# Patient Record
Sex: Male | Born: 1950
Health system: Southern US, Community
[De-identification: ages and names within clinical notes are randomized; demographics above are authoritative.]

## PROBLEM LIST (undated history)

## (undated) DIAGNOSIS — I444 Left anterior fascicular block: Secondary | ICD-10-CM

## (undated) DIAGNOSIS — K2270 Barrett's esophagus without dysplasia: Secondary | ICD-10-CM

## (undated) DIAGNOSIS — J432 Centrilobular emphysema: Secondary | ICD-10-CM

## (undated) DIAGNOSIS — E6609 Other obesity due to excess calories: Secondary | ICD-10-CM

## (undated) DIAGNOSIS — M1990 Unspecified osteoarthritis, unspecified site: Secondary | ICD-10-CM

## (undated) DIAGNOSIS — G4733 Obstructive sleep apnea (adult) (pediatric): Secondary | ICD-10-CM

## (undated) DIAGNOSIS — M7662 Achilles tendinitis, left leg: Secondary | ICD-10-CM

## (undated) DIAGNOSIS — M7752 Other enthesopathy of left foot: Secondary | ICD-10-CM

## (undated) DIAGNOSIS — N400 Enlarged prostate without lower urinary tract symptoms: Secondary | ICD-10-CM

## (undated) DIAGNOSIS — Z9981 Dependence on supplemental oxygen: Secondary | ICD-10-CM

## (undated) DIAGNOSIS — J849 Interstitial pulmonary disease, unspecified: Secondary | ICD-10-CM

## (undated) DIAGNOSIS — R5381 Other malaise: Secondary | ICD-10-CM

## (undated) DIAGNOSIS — G2581 Restless legs syndrome: Secondary | ICD-10-CM

## (undated) DIAGNOSIS — R0609 Other forms of dyspnea: Secondary | ICD-10-CM

## (undated) DIAGNOSIS — Z8616 Personal history of COVID-19: Secondary | ICD-10-CM

## (undated) DIAGNOSIS — G4730 Sleep apnea, unspecified: Secondary | ICD-10-CM

## (undated) DIAGNOSIS — G8929 Other chronic pain: Secondary | ICD-10-CM

## (undated) DIAGNOSIS — F330 Major depressive disorder, recurrent, mild: Secondary | ICD-10-CM

## (undated) DIAGNOSIS — T466X5A Adverse effect of antihyperlipidemic and antiarteriosclerotic drugs, initial encounter: Secondary | ICD-10-CM

## (undated) DIAGNOSIS — I2510 Atherosclerotic heart disease of native coronary artery without angina pectoris: Secondary | ICD-10-CM

## (undated) DIAGNOSIS — R454 Irritability and anger: Secondary | ICD-10-CM

## (undated) DIAGNOSIS — Z8673 Personal history of transient ischemic attack (TIA), and cerebral infarction without residual deficits: Secondary | ICD-10-CM

## (undated) DIAGNOSIS — F419 Anxiety disorder, unspecified: Secondary | ICD-10-CM

## (undated) DIAGNOSIS — I10 Essential (primary) hypertension: Secondary | ICD-10-CM

## (undated) DIAGNOSIS — R7303 Prediabetes: Secondary | ICD-10-CM

## (undated) DIAGNOSIS — E782 Mixed hyperlipidemia: Secondary | ICD-10-CM

## (undated) DIAGNOSIS — I700 Atherosclerosis of aorta: Secondary | ICD-10-CM

## (undated) DIAGNOSIS — E66811 Obesity, class 1: Secondary | ICD-10-CM

## (undated) DIAGNOSIS — C61 Malignant neoplasm of prostate: Secondary | ICD-10-CM

## (undated) DIAGNOSIS — M51369 Other intervertebral disc degeneration, lumbar region without mention of lumbar back pain or lower extremity pain: Secondary | ICD-10-CM

## (undated) DIAGNOSIS — J449 Chronic obstructive pulmonary disease, unspecified: Secondary | ICD-10-CM

## (undated) DIAGNOSIS — I639 Cerebral infarction, unspecified: Secondary | ICD-10-CM

## (undated) DIAGNOSIS — K219 Gastro-esophageal reflux disease without esophagitis: Secondary | ICD-10-CM

## (undated) DIAGNOSIS — G4700 Insomnia, unspecified: Secondary | ICD-10-CM

## (undated) DIAGNOSIS — E291 Testicular hypofunction: Secondary | ICD-10-CM

## (undated) DIAGNOSIS — Z87442 Personal history of urinary calculi: Secondary | ICD-10-CM

## (undated) DIAGNOSIS — M791 Myalgia, unspecified site: Secondary | ICD-10-CM

## (undated) DIAGNOSIS — M7750 Other enthesopathy of unspecified foot: Secondary | ICD-10-CM

## (undated) DIAGNOSIS — R972 Elevated prostate specific antigen [PSA]: Secondary | ICD-10-CM

## (undated) DIAGNOSIS — H9191 Unspecified hearing loss, right ear: Secondary | ICD-10-CM

## (undated) DIAGNOSIS — F32A Depression, unspecified: Secondary | ICD-10-CM

## (undated) DIAGNOSIS — M5136 Other intervertebral disc degeneration, lumbar region: Secondary | ICD-10-CM

## (undated) DIAGNOSIS — J189 Pneumonia, unspecified organism: Secondary | ICD-10-CM

## (undated) HISTORY — DX: Adverse effect of antihyperlipidemic and antiarteriosclerotic drugs, initial encounter: T46.6X5A

## (undated) HISTORY — DX: Mixed hyperlipidemia: E78.2

## (undated) HISTORY — DX: Myalgia, unspecified site: M79.10

## (undated) HISTORY — DX: Other forms of dyspnea: R06.09

## (undated) HISTORY — DX: Interstitial pulmonary disease, unspecified: J84.9

## (undated) HISTORY — DX: Other malaise: R53.81

## (undated) HISTORY — DX: Restless legs syndrome: G25.81

## (undated) HISTORY — DX: Unspecified hearing loss, right ear: H91.91

## (undated) HISTORY — DX: Personal history of COVID-19: Z86.16

## (undated) HISTORY — DX: Left anterior fascicular block: I44.4

## (undated) HISTORY — DX: Obstructive sleep apnea (adult) (pediatric): G47.33

## (undated) HISTORY — DX: Centrilobular emphysema: J43.2

## (undated) HISTORY — DX: Elevated prostate specific antigen (PSA): R97.20

## (undated) HISTORY — DX: Atherosclerosis of aorta: I70.0

## (undated) HISTORY — DX: Prediabetes: R73.03

## (undated) HISTORY — DX: Testicular hypofunction: E29.1

## (undated) HISTORY — DX: Other enthesopathy of unspecified foot and ankle: M77.50

## (undated) HISTORY — DX: Unspecified osteoarthritis, unspecified site: M19.90

## (undated) HISTORY — PX: PROSTATE BIOPSY: SHX241

## (undated) HISTORY — DX: Irritability and anger: R45.4

## (undated) HISTORY — DX: Essential (primary) hypertension: I10

## (undated) HISTORY — DX: Insomnia, unspecified: G47.00

## (undated) HISTORY — DX: Other enthesopathy of left foot and ankle: M77.52

## (undated) HISTORY — DX: Achilles tendinitis, left leg: M76.62

## (undated) HISTORY — DX: Atherosclerotic heart disease of native coronary artery without angina pectoris: I25.10

## (undated) HISTORY — DX: Personal history of transient ischemic attack (TIA), and cerebral infarction without residual deficits: Z86.73

## (undated) HISTORY — DX: Other obesity due to excess calories: E66.09

## (undated) HISTORY — DX: Other intervertebral disc degeneration, lumbar region without mention of lumbar back pain or lower extremity pain: M51.369

## (undated) HISTORY — DX: Obesity, class 1: E66.811

## (undated) HISTORY — DX: Major depressive disorder, recurrent, mild: F33.0

## (undated) HISTORY — DX: Barrett's esophagus without dysplasia: K22.70

## (undated) HISTORY — DX: Other intervertebral disc degeneration, lumbar region: M51.36

## (undated) HISTORY — PX: COLONOSCOPY: SHX174

---

## 2016-03-18 DIAGNOSIS — F339 Major depressive disorder, recurrent, unspecified: Secondary | ICD-10-CM | POA: Insufficient documentation

## 2016-03-18 HISTORY — DX: Major depressive disorder, recurrent, unspecified: F33.9

## 2017-11-18 DIAGNOSIS — R7303 Prediabetes: Secondary | ICD-10-CM | POA: Diagnosis not present

## 2017-11-18 DIAGNOSIS — I10 Essential (primary) hypertension: Secondary | ICD-10-CM | POA: Diagnosis not present

## 2017-11-18 DIAGNOSIS — R350 Frequency of micturition: Secondary | ICD-10-CM | POA: Diagnosis not present

## 2017-11-18 DIAGNOSIS — Z125 Encounter for screening for malignant neoplasm of prostate: Secondary | ICD-10-CM | POA: Diagnosis not present

## 2017-11-18 DIAGNOSIS — E782 Mixed hyperlipidemia: Secondary | ICD-10-CM | POA: Diagnosis not present

## 2017-11-18 DIAGNOSIS — R5381 Other malaise: Secondary | ICD-10-CM | POA: Diagnosis not present

## 2017-11-18 DIAGNOSIS — Z0000 Encounter for general adult medical examination without abnormal findings: Secondary | ICD-10-CM | POA: Diagnosis not present

## 2017-11-18 DIAGNOSIS — K2270 Barrett's esophagus without dysplasia: Secondary | ICD-10-CM | POA: Diagnosis not present

## 2017-11-18 DIAGNOSIS — J449 Chronic obstructive pulmonary disease, unspecified: Secondary | ICD-10-CM | POA: Diagnosis not present

## 2017-11-18 DIAGNOSIS — N401 Enlarged prostate with lower urinary tract symptoms: Secondary | ICD-10-CM | POA: Diagnosis not present

## 2017-11-18 DIAGNOSIS — R5383 Other fatigue: Secondary | ICD-10-CM | POA: Diagnosis not present

## 2017-11-26 DIAGNOSIS — H903 Sensorineural hearing loss, bilateral: Secondary | ICD-10-CM | POA: Diagnosis not present

## 2017-11-26 DIAGNOSIS — H6123 Impacted cerumen, bilateral: Secondary | ICD-10-CM | POA: Diagnosis not present

## 2017-11-26 DIAGNOSIS — J069 Acute upper respiratory infection, unspecified: Secondary | ICD-10-CM | POA: Diagnosis not present

## 2017-12-15 DIAGNOSIS — H903 Sensorineural hearing loss, bilateral: Secondary | ICD-10-CM | POA: Diagnosis not present

## 2017-12-28 DIAGNOSIS — H903 Sensorineural hearing loss, bilateral: Secondary | ICD-10-CM | POA: Diagnosis not present

## 2018-03-17 DIAGNOSIS — N529 Male erectile dysfunction, unspecified: Secondary | ICD-10-CM | POA: Diagnosis not present

## 2018-03-17 DIAGNOSIS — R351 Nocturia: Secondary | ICD-10-CM | POA: Diagnosis not present

## 2018-03-17 DIAGNOSIS — N401 Enlarged prostate with lower urinary tract symptoms: Secondary | ICD-10-CM | POA: Diagnosis not present

## 2018-04-07 DIAGNOSIS — R609 Edema, unspecified: Secondary | ICD-10-CM | POA: Diagnosis not present

## 2018-04-07 DIAGNOSIS — R319 Hematuria, unspecified: Secondary | ICD-10-CM | POA: Diagnosis not present

## 2018-04-07 DIAGNOSIS — R5383 Other fatigue: Secondary | ICD-10-CM | POA: Diagnosis not present

## 2018-04-07 DIAGNOSIS — R5381 Other malaise: Secondary | ICD-10-CM | POA: Diagnosis not present

## 2018-04-07 DIAGNOSIS — M5416 Radiculopathy, lumbar region: Secondary | ICD-10-CM | POA: Diagnosis not present

## 2018-04-07 DIAGNOSIS — Z79899 Other long term (current) drug therapy: Secondary | ICD-10-CM | POA: Diagnosis not present

## 2018-04-15 DIAGNOSIS — M545 Low back pain: Secondary | ICD-10-CM | POA: Diagnosis not present

## 2018-04-15 DIAGNOSIS — M5416 Radiculopathy, lumbar region: Secondary | ICD-10-CM | POA: Diagnosis not present

## 2018-05-04 DIAGNOSIS — N529 Male erectile dysfunction, unspecified: Secondary | ICD-10-CM | POA: Diagnosis not present

## 2018-05-04 DIAGNOSIS — N401 Enlarged prostate with lower urinary tract symptoms: Secondary | ICD-10-CM | POA: Diagnosis not present

## 2018-05-11 DIAGNOSIS — M5136 Other intervertebral disc degeneration, lumbar region: Secondary | ICD-10-CM | POA: Diagnosis not present

## 2018-05-11 DIAGNOSIS — M5416 Radiculopathy, lumbar region: Secondary | ICD-10-CM | POA: Diagnosis not present

## 2018-05-18 DIAGNOSIS — I2510 Atherosclerotic heart disease of native coronary artery without angina pectoris: Secondary | ICD-10-CM | POA: Diagnosis not present

## 2018-05-18 DIAGNOSIS — J449 Chronic obstructive pulmonary disease, unspecified: Secondary | ICD-10-CM | POA: Diagnosis not present

## 2018-05-18 DIAGNOSIS — K2270 Barrett's esophagus without dysplasia: Secondary | ICD-10-CM | POA: Diagnosis not present

## 2018-05-18 DIAGNOSIS — I10 Essential (primary) hypertension: Secondary | ICD-10-CM | POA: Diagnosis not present

## 2018-05-18 DIAGNOSIS — R7303 Prediabetes: Secondary | ICD-10-CM | POA: Diagnosis not present

## 2018-07-14 DIAGNOSIS — M9904 Segmental and somatic dysfunction of sacral region: Secondary | ICD-10-CM | POA: Diagnosis not present

## 2018-07-14 DIAGNOSIS — S336XXA Sprain of sacroiliac joint, initial encounter: Secondary | ICD-10-CM | POA: Diagnosis not present

## 2018-07-14 DIAGNOSIS — M545 Low back pain: Secondary | ICD-10-CM | POA: Diagnosis not present

## 2018-07-14 DIAGNOSIS — S39012A Strain of muscle, fascia and tendon of lower back, initial encounter: Secondary | ICD-10-CM | POA: Diagnosis not present

## 2018-07-14 DIAGNOSIS — S29019A Strain of muscle and tendon of unspecified wall of thorax, initial encounter: Secondary | ICD-10-CM | POA: Diagnosis not present

## 2018-07-14 DIAGNOSIS — M9903 Segmental and somatic dysfunction of lumbar region: Secondary | ICD-10-CM | POA: Diagnosis not present

## 2018-07-14 DIAGNOSIS — M9902 Segmental and somatic dysfunction of thoracic region: Secondary | ICD-10-CM | POA: Diagnosis not present

## 2018-07-14 DIAGNOSIS — M5136 Other intervertebral disc degeneration, lumbar region: Secondary | ICD-10-CM | POA: Diagnosis not present

## 2018-07-15 DIAGNOSIS — S336XXA Sprain of sacroiliac joint, initial encounter: Secondary | ICD-10-CM | POA: Diagnosis not present

## 2018-07-15 DIAGNOSIS — M9904 Segmental and somatic dysfunction of sacral region: Secondary | ICD-10-CM | POA: Diagnosis not present

## 2018-07-15 DIAGNOSIS — S39012A Strain of muscle, fascia and tendon of lower back, initial encounter: Secondary | ICD-10-CM | POA: Diagnosis not present

## 2018-07-15 DIAGNOSIS — M9902 Segmental and somatic dysfunction of thoracic region: Secondary | ICD-10-CM | POA: Diagnosis not present

## 2018-07-15 DIAGNOSIS — M5136 Other intervertebral disc degeneration, lumbar region: Secondary | ICD-10-CM | POA: Diagnosis not present

## 2018-07-15 DIAGNOSIS — M9903 Segmental and somatic dysfunction of lumbar region: Secondary | ICD-10-CM | POA: Diagnosis not present

## 2018-07-15 DIAGNOSIS — S29019A Strain of muscle and tendon of unspecified wall of thorax, initial encounter: Secondary | ICD-10-CM | POA: Diagnosis not present

## 2018-07-15 DIAGNOSIS — M545 Low back pain: Secondary | ICD-10-CM | POA: Diagnosis not present

## 2018-07-21 DIAGNOSIS — M9903 Segmental and somatic dysfunction of lumbar region: Secondary | ICD-10-CM | POA: Diagnosis not present

## 2018-07-21 DIAGNOSIS — S39012A Strain of muscle, fascia and tendon of lower back, initial encounter: Secondary | ICD-10-CM | POA: Diagnosis not present

## 2018-07-21 DIAGNOSIS — M9904 Segmental and somatic dysfunction of sacral region: Secondary | ICD-10-CM | POA: Diagnosis not present

## 2018-07-21 DIAGNOSIS — M545 Low back pain: Secondary | ICD-10-CM | POA: Diagnosis not present

## 2018-07-21 DIAGNOSIS — M5136 Other intervertebral disc degeneration, lumbar region: Secondary | ICD-10-CM | POA: Diagnosis not present

## 2018-07-21 DIAGNOSIS — M9902 Segmental and somatic dysfunction of thoracic region: Secondary | ICD-10-CM | POA: Diagnosis not present

## 2018-07-21 DIAGNOSIS — S336XXA Sprain of sacroiliac joint, initial encounter: Secondary | ICD-10-CM | POA: Diagnosis not present

## 2018-07-21 DIAGNOSIS — S29019A Strain of muscle and tendon of unspecified wall of thorax, initial encounter: Secondary | ICD-10-CM | POA: Diagnosis not present

## 2018-07-22 DIAGNOSIS — M9902 Segmental and somatic dysfunction of thoracic region: Secondary | ICD-10-CM | POA: Diagnosis not present

## 2018-07-22 DIAGNOSIS — M9904 Segmental and somatic dysfunction of sacral region: Secondary | ICD-10-CM | POA: Diagnosis not present

## 2018-07-22 DIAGNOSIS — S29019A Strain of muscle and tendon of unspecified wall of thorax, initial encounter: Secondary | ICD-10-CM | POA: Diagnosis not present

## 2018-07-22 DIAGNOSIS — M545 Low back pain: Secondary | ICD-10-CM | POA: Diagnosis not present

## 2018-07-22 DIAGNOSIS — M5136 Other intervertebral disc degeneration, lumbar region: Secondary | ICD-10-CM | POA: Diagnosis not present

## 2018-07-22 DIAGNOSIS — S336XXA Sprain of sacroiliac joint, initial encounter: Secondary | ICD-10-CM | POA: Diagnosis not present

## 2018-07-22 DIAGNOSIS — S39012A Strain of muscle, fascia and tendon of lower back, initial encounter: Secondary | ICD-10-CM | POA: Diagnosis not present

## 2018-07-22 DIAGNOSIS — M9903 Segmental and somatic dysfunction of lumbar region: Secondary | ICD-10-CM | POA: Diagnosis not present

## 2018-07-23 DIAGNOSIS — M5136 Other intervertebral disc degeneration, lumbar region: Secondary | ICD-10-CM | POA: Diagnosis not present

## 2018-07-23 DIAGNOSIS — M9903 Segmental and somatic dysfunction of lumbar region: Secondary | ICD-10-CM | POA: Diagnosis not present

## 2018-07-23 DIAGNOSIS — M545 Low back pain: Secondary | ICD-10-CM | POA: Diagnosis not present

## 2018-07-23 DIAGNOSIS — M9904 Segmental and somatic dysfunction of sacral region: Secondary | ICD-10-CM | POA: Diagnosis not present

## 2018-07-23 DIAGNOSIS — M9902 Segmental and somatic dysfunction of thoracic region: Secondary | ICD-10-CM | POA: Diagnosis not present

## 2018-07-23 DIAGNOSIS — S39012A Strain of muscle, fascia and tendon of lower back, initial encounter: Secondary | ICD-10-CM | POA: Diagnosis not present

## 2018-07-23 DIAGNOSIS — S29019A Strain of muscle and tendon of unspecified wall of thorax, initial encounter: Secondary | ICD-10-CM | POA: Diagnosis not present

## 2018-07-23 DIAGNOSIS — S336XXA Sprain of sacroiliac joint, initial encounter: Secondary | ICD-10-CM | POA: Diagnosis not present

## 2018-07-26 DIAGNOSIS — S29019A Strain of muscle and tendon of unspecified wall of thorax, initial encounter: Secondary | ICD-10-CM | POA: Diagnosis not present

## 2018-07-26 DIAGNOSIS — M9904 Segmental and somatic dysfunction of sacral region: Secondary | ICD-10-CM | POA: Diagnosis not present

## 2018-07-26 DIAGNOSIS — M9903 Segmental and somatic dysfunction of lumbar region: Secondary | ICD-10-CM | POA: Diagnosis not present

## 2018-07-26 DIAGNOSIS — S336XXA Sprain of sacroiliac joint, initial encounter: Secondary | ICD-10-CM | POA: Diagnosis not present

## 2018-07-26 DIAGNOSIS — M5136 Other intervertebral disc degeneration, lumbar region: Secondary | ICD-10-CM | POA: Diagnosis not present

## 2018-07-26 DIAGNOSIS — S39012A Strain of muscle, fascia and tendon of lower back, initial encounter: Secondary | ICD-10-CM | POA: Diagnosis not present

## 2018-07-26 DIAGNOSIS — M9902 Segmental and somatic dysfunction of thoracic region: Secondary | ICD-10-CM | POA: Diagnosis not present

## 2018-07-26 DIAGNOSIS — M545 Low back pain: Secondary | ICD-10-CM | POA: Diagnosis not present

## 2018-07-28 DIAGNOSIS — S39012A Strain of muscle, fascia and tendon of lower back, initial encounter: Secondary | ICD-10-CM | POA: Diagnosis not present

## 2018-07-28 DIAGNOSIS — M545 Low back pain: Secondary | ICD-10-CM | POA: Diagnosis not present

## 2018-07-28 DIAGNOSIS — S336XXA Sprain of sacroiliac joint, initial encounter: Secondary | ICD-10-CM | POA: Diagnosis not present

## 2018-07-28 DIAGNOSIS — M5136 Other intervertebral disc degeneration, lumbar region: Secondary | ICD-10-CM | POA: Diagnosis not present

## 2018-07-28 DIAGNOSIS — S29019A Strain of muscle and tendon of unspecified wall of thorax, initial encounter: Secondary | ICD-10-CM | POA: Diagnosis not present

## 2018-07-28 DIAGNOSIS — M9904 Segmental and somatic dysfunction of sacral region: Secondary | ICD-10-CM | POA: Diagnosis not present

## 2018-07-28 DIAGNOSIS — M9902 Segmental and somatic dysfunction of thoracic region: Secondary | ICD-10-CM | POA: Diagnosis not present

## 2018-07-28 DIAGNOSIS — M9903 Segmental and somatic dysfunction of lumbar region: Secondary | ICD-10-CM | POA: Diagnosis not present

## 2018-07-30 DIAGNOSIS — S29019A Strain of muscle and tendon of unspecified wall of thorax, initial encounter: Secondary | ICD-10-CM | POA: Diagnosis not present

## 2018-07-30 DIAGNOSIS — M9904 Segmental and somatic dysfunction of sacral region: Secondary | ICD-10-CM | POA: Diagnosis not present

## 2018-07-30 DIAGNOSIS — S336XXA Sprain of sacroiliac joint, initial encounter: Secondary | ICD-10-CM | POA: Diagnosis not present

## 2018-07-30 DIAGNOSIS — M5136 Other intervertebral disc degeneration, lumbar region: Secondary | ICD-10-CM | POA: Diagnosis not present

## 2018-07-30 DIAGNOSIS — M545 Low back pain: Secondary | ICD-10-CM | POA: Diagnosis not present

## 2018-07-30 DIAGNOSIS — M9903 Segmental and somatic dysfunction of lumbar region: Secondary | ICD-10-CM | POA: Diagnosis not present

## 2018-07-30 DIAGNOSIS — M9902 Segmental and somatic dysfunction of thoracic region: Secondary | ICD-10-CM | POA: Diagnosis not present

## 2018-07-30 DIAGNOSIS — S39012A Strain of muscle, fascia and tendon of lower back, initial encounter: Secondary | ICD-10-CM | POA: Diagnosis not present

## 2018-08-02 DIAGNOSIS — S39012A Strain of muscle, fascia and tendon of lower back, initial encounter: Secondary | ICD-10-CM | POA: Diagnosis not present

## 2018-08-02 DIAGNOSIS — M9902 Segmental and somatic dysfunction of thoracic region: Secondary | ICD-10-CM | POA: Diagnosis not present

## 2018-08-02 DIAGNOSIS — M5136 Other intervertebral disc degeneration, lumbar region: Secondary | ICD-10-CM | POA: Diagnosis not present

## 2018-08-02 DIAGNOSIS — S29019A Strain of muscle and tendon of unspecified wall of thorax, initial encounter: Secondary | ICD-10-CM | POA: Diagnosis not present

## 2018-08-02 DIAGNOSIS — S336XXA Sprain of sacroiliac joint, initial encounter: Secondary | ICD-10-CM | POA: Diagnosis not present

## 2018-08-02 DIAGNOSIS — M545 Low back pain: Secondary | ICD-10-CM | POA: Diagnosis not present

## 2018-08-02 DIAGNOSIS — M9904 Segmental and somatic dysfunction of sacral region: Secondary | ICD-10-CM | POA: Diagnosis not present

## 2018-08-02 DIAGNOSIS — M9903 Segmental and somatic dysfunction of lumbar region: Secondary | ICD-10-CM | POA: Diagnosis not present

## 2018-08-04 DIAGNOSIS — R3129 Other microscopic hematuria: Secondary | ICD-10-CM | POA: Diagnosis not present

## 2018-08-04 DIAGNOSIS — N401 Enlarged prostate with lower urinary tract symptoms: Secondary | ICD-10-CM | POA: Diagnosis not present

## 2018-08-06 DIAGNOSIS — R3129 Other microscopic hematuria: Secondary | ICD-10-CM | POA: Diagnosis not present

## 2018-08-06 DIAGNOSIS — N200 Calculus of kidney: Secondary | ICD-10-CM | POA: Diagnosis not present

## 2018-08-06 DIAGNOSIS — I700 Atherosclerosis of aorta: Secondary | ICD-10-CM | POA: Diagnosis not present

## 2018-08-11 DIAGNOSIS — R3129 Other microscopic hematuria: Secondary | ICD-10-CM | POA: Diagnosis not present

## 2018-08-24 DIAGNOSIS — N401 Enlarged prostate with lower urinary tract symptoms: Secondary | ICD-10-CM | POA: Diagnosis not present

## 2018-08-24 DIAGNOSIS — R3129 Other microscopic hematuria: Secondary | ICD-10-CM | POA: Diagnosis not present

## 2018-08-24 DIAGNOSIS — N200 Calculus of kidney: Secondary | ICD-10-CM | POA: Diagnosis not present

## 2018-08-27 DIAGNOSIS — M150 Primary generalized (osteo)arthritis: Secondary | ICD-10-CM | POA: Diagnosis not present

## 2018-08-27 DIAGNOSIS — J449 Chronic obstructive pulmonary disease, unspecified: Secondary | ICD-10-CM | POA: Diagnosis not present

## 2018-08-27 DIAGNOSIS — I10 Essential (primary) hypertension: Secondary | ICD-10-CM | POA: Diagnosis not present

## 2018-08-27 DIAGNOSIS — Z01810 Encounter for preprocedural cardiovascular examination: Secondary | ICD-10-CM | POA: Diagnosis not present

## 2018-08-27 DIAGNOSIS — Z79899 Other long term (current) drug therapy: Secondary | ICD-10-CM | POA: Diagnosis not present

## 2018-08-27 DIAGNOSIS — I2510 Atherosclerotic heart disease of native coronary artery without angina pectoris: Secondary | ICD-10-CM | POA: Diagnosis not present

## 2018-08-27 DIAGNOSIS — Z8673 Personal history of transient ischemic attack (TIA), and cerebral infarction without residual deficits: Secondary | ICD-10-CM | POA: Diagnosis not present

## 2018-08-27 DIAGNOSIS — K2270 Barrett's esophagus without dysplasia: Secondary | ICD-10-CM | POA: Diagnosis not present

## 2018-08-27 DIAGNOSIS — M5136 Other intervertebral disc degeneration, lumbar region: Secondary | ICD-10-CM | POA: Diagnosis not present

## 2018-08-27 DIAGNOSIS — E6609 Other obesity due to excess calories: Secondary | ICD-10-CM | POA: Diagnosis not present

## 2018-08-27 DIAGNOSIS — F330 Major depressive disorder, recurrent, mild: Secondary | ICD-10-CM | POA: Diagnosis not present

## 2018-08-27 DIAGNOSIS — Z23 Encounter for immunization: Secondary | ICD-10-CM | POA: Diagnosis not present

## 2018-08-30 DIAGNOSIS — Z23 Encounter for immunization: Secondary | ICD-10-CM | POA: Diagnosis not present

## 2018-09-01 DIAGNOSIS — I2510 Atherosclerotic heart disease of native coronary artery without angina pectoris: Secondary | ICD-10-CM | POA: Diagnosis not present

## 2018-09-01 DIAGNOSIS — Z01810 Encounter for preprocedural cardiovascular examination: Secondary | ICD-10-CM | POA: Diagnosis not present

## 2018-09-03 DIAGNOSIS — N200 Calculus of kidney: Secondary | ICD-10-CM | POA: Diagnosis not present

## 2018-09-16 DIAGNOSIS — N200 Calculus of kidney: Secondary | ICD-10-CM | POA: Diagnosis not present

## 2018-09-16 DIAGNOSIS — N401 Enlarged prostate with lower urinary tract symptoms: Secondary | ICD-10-CM | POA: Diagnosis not present

## 2018-09-17 DIAGNOSIS — N200 Calculus of kidney: Secondary | ICD-10-CM | POA: Diagnosis not present

## 2018-10-28 DIAGNOSIS — N401 Enlarged prostate with lower urinary tract symptoms: Secondary | ICD-10-CM | POA: Diagnosis not present

## 2018-10-28 DIAGNOSIS — N200 Calculus of kidney: Secondary | ICD-10-CM | POA: Diagnosis not present

## 2018-12-21 DIAGNOSIS — N401 Enlarged prostate with lower urinary tract symptoms: Secondary | ICD-10-CM | POA: Diagnosis not present

## 2018-12-21 DIAGNOSIS — Z125 Encounter for screening for malignant neoplasm of prostate: Secondary | ICD-10-CM | POA: Diagnosis not present

## 2018-12-21 DIAGNOSIS — N200 Calculus of kidney: Secondary | ICD-10-CM | POA: Diagnosis not present

## 2019-01-17 DIAGNOSIS — N401 Enlarged prostate with lower urinary tract symptoms: Secondary | ICD-10-CM | POA: Diagnosis not present

## 2019-01-17 DIAGNOSIS — N200 Calculus of kidney: Secondary | ICD-10-CM | POA: Diagnosis not present

## 2019-01-21 DIAGNOSIS — I252 Old myocardial infarction: Secondary | ICD-10-CM | POA: Diagnosis not present

## 2019-01-21 DIAGNOSIS — N200 Calculus of kidney: Secondary | ICD-10-CM | POA: Diagnosis not present

## 2019-01-21 DIAGNOSIS — M1990 Unspecified osteoarthritis, unspecified site: Secondary | ICD-10-CM | POA: Diagnosis not present

## 2019-01-21 DIAGNOSIS — I2510 Atherosclerotic heart disease of native coronary artery without angina pectoris: Secondary | ICD-10-CM | POA: Diagnosis not present

## 2019-01-21 HISTORY — PX: LITHOTRIPSY: SUR834

## 2019-01-27 DIAGNOSIS — N202 Calculus of kidney with calculus of ureter: Secondary | ICD-10-CM | POA: Diagnosis not present

## 2019-01-27 DIAGNOSIS — N401 Enlarged prostate with lower urinary tract symptoms: Secondary | ICD-10-CM | POA: Diagnosis not present

## 2019-01-27 DIAGNOSIS — I878 Other specified disorders of veins: Secondary | ICD-10-CM | POA: Diagnosis not present

## 2019-01-27 DIAGNOSIS — N200 Calculus of kidney: Secondary | ICD-10-CM | POA: Diagnosis not present

## 2019-02-08 DIAGNOSIS — Z8673 Personal history of transient ischemic attack (TIA), and cerebral infarction without residual deficits: Secondary | ICD-10-CM | POA: Diagnosis not present

## 2019-02-08 DIAGNOSIS — E782 Mixed hyperlipidemia: Secondary | ICD-10-CM | POA: Diagnosis not present

## 2019-02-08 DIAGNOSIS — F5101 Primary insomnia: Secondary | ICD-10-CM | POA: Diagnosis not present

## 2019-02-08 DIAGNOSIS — N401 Enlarged prostate with lower urinary tract symptoms: Secondary | ICD-10-CM | POA: Diagnosis not present

## 2019-02-08 DIAGNOSIS — Z79899 Other long term (current) drug therapy: Secondary | ICD-10-CM | POA: Diagnosis not present

## 2019-02-08 DIAGNOSIS — R7303 Prediabetes: Secondary | ICD-10-CM | POA: Diagnosis not present

## 2019-02-08 DIAGNOSIS — I2510 Atherosclerotic heart disease of native coronary artery without angina pectoris: Secondary | ICD-10-CM | POA: Diagnosis not present

## 2019-02-08 DIAGNOSIS — J449 Chronic obstructive pulmonary disease, unspecified: Secondary | ICD-10-CM | POA: Diagnosis not present

## 2019-02-08 DIAGNOSIS — F330 Major depressive disorder, recurrent, mild: Secondary | ICD-10-CM | POA: Diagnosis not present

## 2019-02-08 DIAGNOSIS — I10 Essential (primary) hypertension: Secondary | ICD-10-CM | POA: Diagnosis not present

## 2019-02-08 DIAGNOSIS — R5381 Other malaise: Secondary | ICD-10-CM | POA: Diagnosis not present

## 2019-02-08 DIAGNOSIS — G2581 Restless legs syndrome: Secondary | ICD-10-CM | POA: Diagnosis not present

## 2019-02-08 DIAGNOSIS — M150 Primary generalized (osteo)arthritis: Secondary | ICD-10-CM | POA: Diagnosis not present

## 2019-02-08 DIAGNOSIS — R5383 Other fatigue: Secondary | ICD-10-CM | POA: Diagnosis not present

## 2019-02-14 DIAGNOSIS — I2510 Atherosclerotic heart disease of native coronary artery without angina pectoris: Secondary | ICD-10-CM | POA: Diagnosis not present

## 2019-02-14 DIAGNOSIS — N23 Unspecified renal colic: Secondary | ICD-10-CM | POA: Diagnosis not present

## 2019-02-14 DIAGNOSIS — N401 Enlarged prostate with lower urinary tract symptoms: Secondary | ICD-10-CM | POA: Diagnosis not present

## 2019-02-14 DIAGNOSIS — R1031 Right lower quadrant pain: Secondary | ICD-10-CM | POA: Diagnosis not present

## 2019-02-14 DIAGNOSIS — N200 Calculus of kidney: Secondary | ICD-10-CM | POA: Diagnosis not present

## 2019-02-14 DIAGNOSIS — I252 Old myocardial infarction: Secondary | ICD-10-CM | POA: Diagnosis not present

## 2019-02-14 DIAGNOSIS — J449 Chronic obstructive pulmonary disease, unspecified: Secondary | ICD-10-CM | POA: Diagnosis not present

## 2019-02-14 DIAGNOSIS — I10 Essential (primary) hypertension: Secondary | ICD-10-CM | POA: Diagnosis not present

## 2019-03-14 DIAGNOSIS — N401 Enlarged prostate with lower urinary tract symptoms: Secondary | ICD-10-CM | POA: Diagnosis not present

## 2019-03-14 DIAGNOSIS — N200 Calculus of kidney: Secondary | ICD-10-CM | POA: Diagnosis not present

## 2019-03-14 DIAGNOSIS — R1031 Right lower quadrant pain: Secondary | ICD-10-CM | POA: Diagnosis not present

## 2019-04-13 DIAGNOSIS — R1031 Right lower quadrant pain: Secondary | ICD-10-CM | POA: Diagnosis not present

## 2019-04-13 DIAGNOSIS — N200 Calculus of kidney: Secondary | ICD-10-CM | POA: Diagnosis not present

## 2019-10-27 DIAGNOSIS — M5136 Other intervertebral disc degeneration, lumbar region: Secondary | ICD-10-CM | POA: Diagnosis not present

## 2019-10-27 DIAGNOSIS — Z125 Encounter for screening for malignant neoplasm of prostate: Secondary | ICD-10-CM | POA: Diagnosis not present

## 2019-10-27 DIAGNOSIS — F330 Major depressive disorder, recurrent, mild: Secondary | ICD-10-CM | POA: Diagnosis not present

## 2019-10-27 DIAGNOSIS — Z0000 Encounter for general adult medical examination without abnormal findings: Secondary | ICD-10-CM | POA: Diagnosis not present

## 2019-10-27 DIAGNOSIS — N401 Enlarged prostate with lower urinary tract symptoms: Secondary | ICD-10-CM | POA: Diagnosis not present

## 2019-10-27 DIAGNOSIS — Z8673 Personal history of transient ischemic attack (TIA), and cerebral infarction without residual deficits: Secondary | ICD-10-CM | POA: Diagnosis not present

## 2019-10-27 DIAGNOSIS — Z23 Encounter for immunization: Secondary | ICD-10-CM | POA: Diagnosis not present

## 2019-10-27 DIAGNOSIS — I10 Essential (primary) hypertension: Secondary | ICD-10-CM | POA: Diagnosis not present

## 2019-10-27 DIAGNOSIS — I700 Atherosclerosis of aorta: Secondary | ICD-10-CM | POA: Diagnosis not present

## 2019-10-27 DIAGNOSIS — F419 Anxiety disorder, unspecified: Secondary | ICD-10-CM | POA: Diagnosis not present

## 2019-10-27 DIAGNOSIS — R5381 Other malaise: Secondary | ICD-10-CM | POA: Diagnosis not present

## 2019-10-27 DIAGNOSIS — R5383 Other fatigue: Secondary | ICD-10-CM | POA: Diagnosis not present

## 2019-10-27 DIAGNOSIS — R7303 Prediabetes: Secondary | ICD-10-CM | POA: Diagnosis not present

## 2019-10-27 DIAGNOSIS — E782 Mixed hyperlipidemia: Secondary | ICD-10-CM | POA: Diagnosis not present

## 2019-11-21 DIAGNOSIS — R519 Headache, unspecified: Secondary | ICD-10-CM | POA: Diagnosis not present

## 2019-11-21 DIAGNOSIS — R0602 Shortness of breath: Secondary | ICD-10-CM | POA: Diagnosis not present

## 2019-11-21 DIAGNOSIS — U071 COVID-19: Secondary | ICD-10-CM | POA: Diagnosis not present

## 2019-11-21 DIAGNOSIS — R05 Cough: Secondary | ICD-10-CM | POA: Diagnosis not present

## 2019-11-21 DIAGNOSIS — R0981 Nasal congestion: Secondary | ICD-10-CM | POA: Diagnosis not present

## 2019-11-25 DIAGNOSIS — M545 Low back pain: Secondary | ICD-10-CM | POA: Diagnosis not present

## 2019-11-25 DIAGNOSIS — Z888 Allergy status to other drugs, medicaments and biological substances status: Secondary | ICD-10-CM | POA: Diagnosis not present

## 2019-11-25 DIAGNOSIS — Z881 Allergy status to other antibiotic agents status: Secondary | ICD-10-CM | POA: Diagnosis not present

## 2019-11-25 DIAGNOSIS — Z79899 Other long term (current) drug therapy: Secondary | ICD-10-CM | POA: Diagnosis not present

## 2019-11-25 DIAGNOSIS — A4189 Other specified sepsis: Secondary | ICD-10-CM | POA: Diagnosis not present

## 2019-11-25 DIAGNOSIS — R519 Headache, unspecified: Secondary | ICD-10-CM | POA: Diagnosis not present

## 2019-11-25 DIAGNOSIS — N401 Enlarged prostate with lower urinary tract symptoms: Secondary | ICD-10-CM | POA: Diagnosis not present

## 2019-11-25 DIAGNOSIS — R079 Chest pain, unspecified: Secondary | ICD-10-CM | POA: Diagnosis not present

## 2019-11-25 DIAGNOSIS — J1289 Other viral pneumonia: Secondary | ICD-10-CM | POA: Diagnosis not present

## 2019-11-25 DIAGNOSIS — Z6831 Body mass index (BMI) 31.0-31.9, adult: Secondary | ICD-10-CM | POA: Diagnosis not present

## 2019-11-25 DIAGNOSIS — J440 Chronic obstructive pulmonary disease with acute lower respiratory infection: Secondary | ICD-10-CM | POA: Diagnosis not present

## 2019-11-25 DIAGNOSIS — U071 COVID-19: Secondary | ICD-10-CM | POA: Diagnosis not present

## 2019-11-25 DIAGNOSIS — I10 Essential (primary) hypertension: Secondary | ICD-10-CM | POA: Diagnosis not present

## 2019-11-25 DIAGNOSIS — F419 Anxiety disorder, unspecified: Secondary | ICD-10-CM | POA: Diagnosis not present

## 2019-11-25 DIAGNOSIS — E785 Hyperlipidemia, unspecified: Secondary | ICD-10-CM | POA: Diagnosis not present

## 2019-11-25 DIAGNOSIS — R7303 Prediabetes: Secondary | ICD-10-CM | POA: Diagnosis not present

## 2019-11-25 DIAGNOSIS — J1282 Pneumonia due to coronavirus disease 2019: Secondary | ICD-10-CM | POA: Diagnosis not present

## 2019-11-25 DIAGNOSIS — R350 Frequency of micturition: Secondary | ICD-10-CM | POA: Diagnosis not present

## 2019-11-25 DIAGNOSIS — G8929 Other chronic pain: Secondary | ICD-10-CM | POA: Diagnosis not present

## 2019-11-25 DIAGNOSIS — J189 Pneumonia, unspecified organism: Secondary | ICD-10-CM | POA: Diagnosis not present

## 2019-11-25 DIAGNOSIS — E876 Hypokalemia: Secondary | ICD-10-CM | POA: Diagnosis not present

## 2019-11-25 DIAGNOSIS — R0602 Shortness of breath: Secondary | ICD-10-CM | POA: Diagnosis not present

## 2019-11-25 DIAGNOSIS — R6520 Severe sepsis without septic shock: Secondary | ICD-10-CM | POA: Diagnosis not present

## 2019-11-25 DIAGNOSIS — J9601 Acute respiratory failure with hypoxia: Secondary | ICD-10-CM | POA: Diagnosis not present

## 2019-11-25 DIAGNOSIS — N400 Enlarged prostate without lower urinary tract symptoms: Secondary | ICD-10-CM | POA: Diagnosis not present

## 2019-11-25 DIAGNOSIS — Z7902 Long term (current) use of antithrombotics/antiplatelets: Secondary | ICD-10-CM | POA: Diagnosis not present

## 2019-11-25 DIAGNOSIS — E8809 Other disorders of plasma-protein metabolism, not elsewhere classified: Secondary | ICD-10-CM | POA: Diagnosis not present

## 2019-11-25 DIAGNOSIS — Z8673 Personal history of transient ischemic attack (TIA), and cerebral infarction without residual deficits: Secondary | ICD-10-CM | POA: Diagnosis not present

## 2019-11-25 DIAGNOSIS — E669 Obesity, unspecified: Secondary | ICD-10-CM | POA: Diagnosis not present

## 2019-11-25 DIAGNOSIS — I2510 Atherosclerotic heart disease of native coronary artery without angina pectoris: Secondary | ICD-10-CM | POA: Diagnosis not present

## 2019-11-25 DIAGNOSIS — Z87891 Personal history of nicotine dependence: Secondary | ICD-10-CM | POA: Diagnosis not present

## 2019-11-25 DIAGNOSIS — E782 Mixed hyperlipidemia: Secondary | ICD-10-CM | POA: Diagnosis not present

## 2019-11-25 DIAGNOSIS — J449 Chronic obstructive pulmonary disease, unspecified: Secondary | ICD-10-CM | POA: Diagnosis not present

## 2019-11-25 DIAGNOSIS — Z9981 Dependence on supplemental oxygen: Secondary | ICD-10-CM | POA: Diagnosis not present

## 2019-11-25 DIAGNOSIS — R5383 Other fatigue: Secondary | ICD-10-CM | POA: Diagnosis not present

## 2019-12-06 DIAGNOSIS — J449 Chronic obstructive pulmonary disease, unspecified: Secondary | ICD-10-CM | POA: Diagnosis not present

## 2019-12-06 DIAGNOSIS — U071 COVID-19: Secondary | ICD-10-CM | POA: Diagnosis not present

## 2019-12-07 DIAGNOSIS — Z8673 Personal history of transient ischemic attack (TIA), and cerebral infarction without residual deficits: Secondary | ICD-10-CM | POA: Diagnosis not present

## 2019-12-07 DIAGNOSIS — E6609 Other obesity due to excess calories: Secondary | ICD-10-CM | POA: Diagnosis not present

## 2019-12-07 DIAGNOSIS — J1282 Pneumonia due to coronavirus disease 2019: Secondary | ICD-10-CM | POA: Diagnosis not present

## 2019-12-07 DIAGNOSIS — R7303 Prediabetes: Secondary | ICD-10-CM | POA: Diagnosis not present

## 2019-12-07 DIAGNOSIS — J449 Chronic obstructive pulmonary disease, unspecified: Secondary | ICD-10-CM | POA: Diagnosis not present

## 2019-12-07 DIAGNOSIS — D649 Anemia, unspecified: Secondary | ICD-10-CM | POA: Diagnosis not present

## 2019-12-07 DIAGNOSIS — U071 COVID-19: Secondary | ICD-10-CM | POA: Diagnosis not present

## 2019-12-07 DIAGNOSIS — E782 Mixed hyperlipidemia: Secondary | ICD-10-CM | POA: Diagnosis not present

## 2019-12-07 DIAGNOSIS — I10 Essential (primary) hypertension: Secondary | ICD-10-CM | POA: Diagnosis not present

## 2019-12-07 DIAGNOSIS — Z6830 Body mass index (BMI) 30.0-30.9, adult: Secondary | ICD-10-CM | POA: Diagnosis not present

## 2019-12-22 DIAGNOSIS — Z8673 Personal history of transient ischemic attack (TIA), and cerebral infarction without residual deficits: Secondary | ICD-10-CM | POA: Diagnosis not present

## 2019-12-22 DIAGNOSIS — R7303 Prediabetes: Secondary | ICD-10-CM | POA: Diagnosis not present

## 2019-12-22 DIAGNOSIS — J1282 Pneumonia due to coronavirus disease 2019: Secondary | ICD-10-CM | POA: Diagnosis not present

## 2019-12-22 DIAGNOSIS — F419 Anxiety disorder, unspecified: Secondary | ICD-10-CM | POA: Diagnosis not present

## 2019-12-22 DIAGNOSIS — R05 Cough: Secondary | ICD-10-CM | POA: Diagnosis not present

## 2019-12-22 DIAGNOSIS — E876 Hypokalemia: Secondary | ICD-10-CM | POA: Diagnosis not present

## 2019-12-22 DIAGNOSIS — R197 Diarrhea, unspecified: Secondary | ICD-10-CM | POA: Diagnosis not present

## 2019-12-22 DIAGNOSIS — J9621 Acute and chronic respiratory failure with hypoxia: Secondary | ICD-10-CM | POA: Diagnosis not present

## 2019-12-22 DIAGNOSIS — Z8616 Personal history of COVID-19: Secondary | ICD-10-CM | POA: Diagnosis not present

## 2019-12-22 DIAGNOSIS — R918 Other nonspecific abnormal finding of lung field: Secondary | ICD-10-CM | POA: Diagnosis not present

## 2019-12-22 DIAGNOSIS — E43 Unspecified severe protein-calorie malnutrition: Secondary | ICD-10-CM | POA: Diagnosis not present

## 2019-12-22 DIAGNOSIS — R112 Nausea with vomiting, unspecified: Secondary | ICD-10-CM | POA: Diagnosis not present

## 2019-12-22 DIAGNOSIS — Z7902 Long term (current) use of antithrombotics/antiplatelets: Secondary | ICD-10-CM | POA: Diagnosis not present

## 2019-12-22 DIAGNOSIS — Z9981 Dependence on supplemental oxygen: Secondary | ICD-10-CM | POA: Diagnosis not present

## 2019-12-22 DIAGNOSIS — E782 Mixed hyperlipidemia: Secondary | ICD-10-CM | POA: Diagnosis not present

## 2019-12-22 DIAGNOSIS — I10 Essential (primary) hypertension: Secondary | ICD-10-CM | POA: Diagnosis not present

## 2019-12-22 DIAGNOSIS — U071 COVID-19: Secondary | ICD-10-CM | POA: Diagnosis not present

## 2019-12-22 DIAGNOSIS — J181 Lobar pneumonia, unspecified organism: Secondary | ICD-10-CM | POA: Diagnosis not present

## 2019-12-22 DIAGNOSIS — F329 Major depressive disorder, single episode, unspecified: Secondary | ICD-10-CM | POA: Diagnosis not present

## 2019-12-22 DIAGNOSIS — R531 Weakness: Secondary | ICD-10-CM | POA: Diagnosis not present

## 2019-12-22 DIAGNOSIS — N400 Enlarged prostate without lower urinary tract symptoms: Secondary | ICD-10-CM | POA: Diagnosis not present

## 2019-12-22 DIAGNOSIS — B948 Sequelae of other specified infectious and parasitic diseases: Secondary | ICD-10-CM | POA: Diagnosis not present

## 2019-12-22 DIAGNOSIS — R9389 Abnormal findings on diagnostic imaging of other specified body structures: Secondary | ICD-10-CM | POA: Diagnosis not present

## 2019-12-22 DIAGNOSIS — R0602 Shortness of breath: Secondary | ICD-10-CM | POA: Diagnosis not present

## 2019-12-22 DIAGNOSIS — J440 Chronic obstructive pulmonary disease with acute lower respiratory infection: Secondary | ICD-10-CM | POA: Diagnosis not present

## 2019-12-22 DIAGNOSIS — K219 Gastro-esophageal reflux disease without esophagitis: Secondary | ICD-10-CM | POA: Diagnosis not present

## 2019-12-22 DIAGNOSIS — Z20822 Contact with and (suspected) exposure to covid-19: Secondary | ICD-10-CM | POA: Diagnosis not present

## 2019-12-22 DIAGNOSIS — J9601 Acute respiratory failure with hypoxia: Secondary | ICD-10-CM | POA: Diagnosis not present

## 2019-12-22 DIAGNOSIS — J449 Chronic obstructive pulmonary disease, unspecified: Secondary | ICD-10-CM | POA: Diagnosis not present

## 2019-12-22 DIAGNOSIS — Z87891 Personal history of nicotine dependence: Secondary | ICD-10-CM | POA: Diagnosis not present

## 2019-12-22 DIAGNOSIS — I639 Cerebral infarction, unspecified: Secondary | ICD-10-CM | POA: Diagnosis not present

## 2019-12-22 DIAGNOSIS — J159 Unspecified bacterial pneumonia: Secondary | ICD-10-CM | POA: Diagnosis not present

## 2019-12-22 DIAGNOSIS — R5381 Other malaise: Secondary | ICD-10-CM | POA: Diagnosis not present

## 2019-12-22 DIAGNOSIS — R0902 Hypoxemia: Secondary | ICD-10-CM | POA: Diagnosis not present

## 2019-12-22 DIAGNOSIS — J189 Pneumonia, unspecified organism: Secondary | ICD-10-CM | POA: Diagnosis not present

## 2019-12-22 DIAGNOSIS — Z79899 Other long term (current) drug therapy: Secondary | ICD-10-CM | POA: Diagnosis not present

## 2019-12-22 DIAGNOSIS — Z7901 Long term (current) use of anticoagulants: Secondary | ICD-10-CM | POA: Diagnosis not present

## 2020-01-02 MED ORDER — GENERIC EXTERNAL MEDICATION
6.00 | Status: DC
Start: 2020-01-03 — End: 2020-01-02

## 2020-01-02 MED ORDER — ONDANSETRON HCL 4 MG/2ML IJ SOLN
4.00 | INTRAMUSCULAR | Status: DC
Start: ? — End: 2020-01-02

## 2020-01-02 MED ORDER — CLOPIDOGREL BISULFATE 75 MG PO TABS
75.00 | ORAL_TABLET | ORAL | Status: DC
Start: 2020-01-03 — End: 2020-01-02

## 2020-01-02 MED ORDER — QUETIAPINE FUMARATE 50 MG PO TABS
50.00 | ORAL_TABLET | ORAL | Status: DC
Start: 2020-01-02 — End: 2020-01-02

## 2020-01-02 MED ORDER — NITROGLYCERIN 0.4 MG SL SUBL
0.40 | SUBLINGUAL_TABLET | SUBLINGUAL | Status: DC
Start: ? — End: 2020-01-02

## 2020-01-02 MED ORDER — PANTOPRAZOLE SODIUM 40 MG PO TBEC
40.00 | DELAYED_RELEASE_TABLET | ORAL | Status: DC
Start: 2020-01-03 — End: 2020-01-02

## 2020-01-02 MED ORDER — FUROSEMIDE 40 MG PO TABS
40.00 | ORAL_TABLET | ORAL | Status: DC
Start: 2020-01-03 — End: 2020-01-02

## 2020-01-02 MED ORDER — ALBUTEROL SULFATE HFA 108 (90 BASE) MCG/ACT IN AERS
2.00 | INHALATION_SPRAY | RESPIRATORY_TRACT | Status: DC
Start: ? — End: 2020-01-02

## 2020-01-02 MED ORDER — TAMSULOSIN HCL 0.4 MG PO CAPS
0.40 | ORAL_CAPSULE | ORAL | Status: DC
Start: 2020-01-03 — End: 2020-01-02

## 2020-01-02 MED ORDER — GUAIFENESIN ER 600 MG PO TB12
600.00 | ORAL_TABLET | ORAL | Status: DC
Start: 2020-01-03 — End: 2020-01-02

## 2020-01-02 MED ORDER — TRAMADOL HCL 50 MG PO TABS
50.00 | ORAL_TABLET | ORAL | Status: DC
Start: ? — End: 2020-01-02

## 2020-01-02 MED ORDER — ACETAMINOPHEN 500 MG PO TABS
1000.00 | ORAL_TABLET | ORAL | Status: DC
Start: ? — End: 2020-01-02

## 2020-01-02 MED ORDER — FLUTICASONE PROPIONATE 50 MCG/ACT NA SUSP
1.00 | NASAL | Status: DC
Start: 2020-01-03 — End: 2020-01-02

## 2020-01-02 MED ORDER — ALBUTEROL SULFATE HFA 108 (90 BASE) MCG/ACT IN AERS
2.00 | INHALATION_SPRAY | RESPIRATORY_TRACT | Status: DC
Start: 2020-01-03 — End: 2020-01-02

## 2020-01-02 MED ORDER — ALPRAZOLAM 0.5 MG PO TABS
0.50 | ORAL_TABLET | ORAL | Status: DC
Start: ? — End: 2020-01-02

## 2020-01-02 MED ORDER — CALCIUM CARBONATE ANTACID 500 MG PO CHEW
500.00 | CHEWABLE_TABLET | ORAL | Status: DC
Start: 2020-01-03 — End: 2020-01-02

## 2020-01-02 MED ORDER — ENOXAPARIN SODIUM 40 MG/0.4ML ~~LOC~~ SOLN
40.00 | SUBCUTANEOUS | Status: DC
Start: 2020-01-03 — End: 2020-01-02

## 2020-01-07 DIAGNOSIS — J440 Chronic obstructive pulmonary disease with acute lower respiratory infection: Secondary | ICD-10-CM | POA: Diagnosis not present

## 2020-01-07 DIAGNOSIS — E663 Overweight: Secondary | ICD-10-CM | POA: Diagnosis not present

## 2020-01-07 DIAGNOSIS — N400 Enlarged prostate without lower urinary tract symptoms: Secondary | ICD-10-CM | POA: Diagnosis not present

## 2020-01-07 DIAGNOSIS — J9621 Acute and chronic respiratory failure with hypoxia: Secondary | ICD-10-CM | POA: Diagnosis not present

## 2020-01-07 DIAGNOSIS — E43 Unspecified severe protein-calorie malnutrition: Secondary | ICD-10-CM | POA: Diagnosis not present

## 2020-01-07 DIAGNOSIS — J1282 Pneumonia due to coronavirus disease 2019: Secondary | ICD-10-CM | POA: Diagnosis not present

## 2020-01-07 DIAGNOSIS — F419 Anxiety disorder, unspecified: Secondary | ICD-10-CM | POA: Diagnosis not present

## 2020-01-07 DIAGNOSIS — U071 COVID-19: Secondary | ICD-10-CM | POA: Diagnosis not present

## 2020-01-07 DIAGNOSIS — Z87891 Personal history of nicotine dependence: Secondary | ICD-10-CM | POA: Diagnosis not present

## 2020-01-07 DIAGNOSIS — Z8673 Personal history of transient ischemic attack (TIA), and cerebral infarction without residual deficits: Secondary | ICD-10-CM | POA: Diagnosis not present

## 2020-01-07 DIAGNOSIS — Z6829 Body mass index (BMI) 29.0-29.9, adult: Secondary | ICD-10-CM | POA: Diagnosis not present

## 2020-01-07 DIAGNOSIS — E782 Mixed hyperlipidemia: Secondary | ICD-10-CM | POA: Diagnosis not present

## 2020-01-07 DIAGNOSIS — I10 Essential (primary) hypertension: Secondary | ICD-10-CM | POA: Diagnosis not present

## 2020-01-16 DIAGNOSIS — Z8673 Personal history of transient ischemic attack (TIA), and cerebral infarction without residual deficits: Secondary | ICD-10-CM | POA: Diagnosis not present

## 2020-01-16 DIAGNOSIS — J440 Chronic obstructive pulmonary disease with acute lower respiratory infection: Secondary | ICD-10-CM | POA: Diagnosis not present

## 2020-01-16 DIAGNOSIS — Z87891 Personal history of nicotine dependence: Secondary | ICD-10-CM | POA: Diagnosis not present

## 2020-01-16 DIAGNOSIS — E782 Mixed hyperlipidemia: Secondary | ICD-10-CM | POA: Diagnosis not present

## 2020-01-16 DIAGNOSIS — F419 Anxiety disorder, unspecified: Secondary | ICD-10-CM | POA: Diagnosis not present

## 2020-01-16 DIAGNOSIS — E43 Unspecified severe protein-calorie malnutrition: Secondary | ICD-10-CM | POA: Diagnosis not present

## 2020-01-16 DIAGNOSIS — E663 Overweight: Secondary | ICD-10-CM | POA: Diagnosis not present

## 2020-01-16 DIAGNOSIS — Z6829 Body mass index (BMI) 29.0-29.9, adult: Secondary | ICD-10-CM | POA: Diagnosis not present

## 2020-01-16 DIAGNOSIS — J9621 Acute and chronic respiratory failure with hypoxia: Secondary | ICD-10-CM | POA: Diagnosis not present

## 2020-01-16 DIAGNOSIS — I10 Essential (primary) hypertension: Secondary | ICD-10-CM | POA: Diagnosis not present

## 2020-01-16 DIAGNOSIS — N400 Enlarged prostate without lower urinary tract symptoms: Secondary | ICD-10-CM | POA: Diagnosis not present

## 2020-01-16 DIAGNOSIS — J1282 Pneumonia due to coronavirus disease 2019: Secondary | ICD-10-CM | POA: Diagnosis not present

## 2020-01-16 DIAGNOSIS — U071 COVID-19: Secondary | ICD-10-CM | POA: Diagnosis not present

## 2020-01-18 DIAGNOSIS — J449 Chronic obstructive pulmonary disease, unspecified: Secondary | ICD-10-CM | POA: Diagnosis not present

## 2020-01-18 DIAGNOSIS — J849 Interstitial pulmonary disease, unspecified: Secondary | ICD-10-CM | POA: Diagnosis not present

## 2020-01-18 DIAGNOSIS — R0602 Shortness of breath: Secondary | ICD-10-CM | POA: Diagnosis not present

## 2020-01-18 DIAGNOSIS — J9601 Acute respiratory failure with hypoxia: Secondary | ICD-10-CM | POA: Diagnosis not present

## 2020-01-19 DIAGNOSIS — U071 COVID-19: Secondary | ICD-10-CM | POA: Diagnosis not present

## 2020-01-19 DIAGNOSIS — R5383 Other fatigue: Secondary | ICD-10-CM | POA: Diagnosis not present

## 2020-01-19 DIAGNOSIS — R7303 Prediabetes: Secondary | ICD-10-CM | POA: Diagnosis not present

## 2020-01-19 DIAGNOSIS — J1282 Pneumonia due to coronavirus disease 2019: Secondary | ICD-10-CM | POA: Diagnosis not present

## 2020-01-19 DIAGNOSIS — E441 Mild protein-calorie malnutrition: Secondary | ICD-10-CM | POA: Diagnosis not present

## 2020-01-19 DIAGNOSIS — J9611 Chronic respiratory failure with hypoxia: Secondary | ICD-10-CM | POA: Diagnosis not present

## 2020-01-19 DIAGNOSIS — J449 Chronic obstructive pulmonary disease, unspecified: Secondary | ICD-10-CM | POA: Diagnosis not present

## 2020-01-19 DIAGNOSIS — R5381 Other malaise: Secondary | ICD-10-CM | POA: Diagnosis not present

## 2020-02-03 DIAGNOSIS — J449 Chronic obstructive pulmonary disease, unspecified: Secondary | ICD-10-CM | POA: Diagnosis not present

## 2020-02-03 DIAGNOSIS — U071 COVID-19: Secondary | ICD-10-CM | POA: Diagnosis not present

## 2020-02-06 DIAGNOSIS — U071 COVID-19: Secondary | ICD-10-CM | POA: Diagnosis not present

## 2020-02-06 DIAGNOSIS — J440 Chronic obstructive pulmonary disease with acute lower respiratory infection: Secondary | ICD-10-CM | POA: Diagnosis not present

## 2020-02-06 DIAGNOSIS — E663 Overweight: Secondary | ICD-10-CM | POA: Diagnosis not present

## 2020-02-06 DIAGNOSIS — J1282 Pneumonia due to coronavirus disease 2019: Secondary | ICD-10-CM | POA: Diagnosis not present

## 2020-02-06 DIAGNOSIS — E782 Mixed hyperlipidemia: Secondary | ICD-10-CM | POA: Diagnosis not present

## 2020-02-06 DIAGNOSIS — F419 Anxiety disorder, unspecified: Secondary | ICD-10-CM | POA: Diagnosis not present

## 2020-02-06 DIAGNOSIS — J9621 Acute and chronic respiratory failure with hypoxia: Secondary | ICD-10-CM | POA: Diagnosis not present

## 2020-02-06 DIAGNOSIS — N400 Enlarged prostate without lower urinary tract symptoms: Secondary | ICD-10-CM | POA: Diagnosis not present

## 2020-02-06 DIAGNOSIS — Z6829 Body mass index (BMI) 29.0-29.9, adult: Secondary | ICD-10-CM | POA: Diagnosis not present

## 2020-02-06 DIAGNOSIS — I10 Essential (primary) hypertension: Secondary | ICD-10-CM | POA: Diagnosis not present

## 2020-02-06 DIAGNOSIS — Z8673 Personal history of transient ischemic attack (TIA), and cerebral infarction without residual deficits: Secondary | ICD-10-CM | POA: Diagnosis not present

## 2020-02-06 DIAGNOSIS — E43 Unspecified severe protein-calorie malnutrition: Secondary | ICD-10-CM | POA: Diagnosis not present

## 2020-02-16 DIAGNOSIS — Z8673 Personal history of transient ischemic attack (TIA), and cerebral infarction without residual deficits: Secondary | ICD-10-CM | POA: Diagnosis not present

## 2020-02-16 DIAGNOSIS — I10 Essential (primary) hypertension: Secondary | ICD-10-CM | POA: Diagnosis not present

## 2020-02-16 DIAGNOSIS — U071 COVID-19: Secondary | ICD-10-CM | POA: Diagnosis not present

## 2020-02-16 DIAGNOSIS — Z6829 Body mass index (BMI) 29.0-29.9, adult: Secondary | ICD-10-CM | POA: Diagnosis not present

## 2020-02-16 DIAGNOSIS — J1282 Pneumonia due to coronavirus disease 2019: Secondary | ICD-10-CM | POA: Diagnosis not present

## 2020-02-16 DIAGNOSIS — E43 Unspecified severe protein-calorie malnutrition: Secondary | ICD-10-CM | POA: Diagnosis not present

## 2020-02-16 DIAGNOSIS — J440 Chronic obstructive pulmonary disease with acute lower respiratory infection: Secondary | ICD-10-CM | POA: Diagnosis not present

## 2020-02-16 DIAGNOSIS — E663 Overweight: Secondary | ICD-10-CM | POA: Diagnosis not present

## 2020-02-16 DIAGNOSIS — E782 Mixed hyperlipidemia: Secondary | ICD-10-CM | POA: Diagnosis not present

## 2020-02-16 DIAGNOSIS — N400 Enlarged prostate without lower urinary tract symptoms: Secondary | ICD-10-CM | POA: Diagnosis not present

## 2020-02-16 DIAGNOSIS — J9621 Acute and chronic respiratory failure with hypoxia: Secondary | ICD-10-CM | POA: Diagnosis not present

## 2020-02-16 DIAGNOSIS — Z87891 Personal history of nicotine dependence: Secondary | ICD-10-CM | POA: Diagnosis not present

## 2020-02-16 DIAGNOSIS — F419 Anxiety disorder, unspecified: Secondary | ICD-10-CM | POA: Diagnosis not present

## 2020-02-29 DIAGNOSIS — J449 Chronic obstructive pulmonary disease, unspecified: Secondary | ICD-10-CM | POA: Diagnosis not present

## 2020-02-29 DIAGNOSIS — J9611 Chronic respiratory failure with hypoxia: Secondary | ICD-10-CM | POA: Diagnosis not present

## 2020-02-29 DIAGNOSIS — R7303 Prediabetes: Secondary | ICD-10-CM | POA: Diagnosis not present

## 2020-02-29 DIAGNOSIS — I10 Essential (primary) hypertension: Secondary | ICD-10-CM | POA: Diagnosis not present

## 2020-02-29 DIAGNOSIS — F330 Major depressive disorder, recurrent, mild: Secondary | ICD-10-CM | POA: Diagnosis not present

## 2020-02-29 DIAGNOSIS — Z8616 Personal history of COVID-19: Secondary | ICD-10-CM | POA: Diagnosis not present

## 2020-03-05 DIAGNOSIS — J449 Chronic obstructive pulmonary disease, unspecified: Secondary | ICD-10-CM | POA: Diagnosis not present

## 2020-03-05 DIAGNOSIS — U071 COVID-19: Secondary | ICD-10-CM | POA: Diagnosis not present

## 2020-03-07 DIAGNOSIS — Z87891 Personal history of nicotine dependence: Secondary | ICD-10-CM | POA: Diagnosis not present

## 2020-03-07 DIAGNOSIS — E663 Overweight: Secondary | ICD-10-CM | POA: Diagnosis not present

## 2020-03-07 DIAGNOSIS — I10 Essential (primary) hypertension: Secondary | ICD-10-CM | POA: Diagnosis not present

## 2020-03-07 DIAGNOSIS — E782 Mixed hyperlipidemia: Secondary | ICD-10-CM | POA: Diagnosis not present

## 2020-03-07 DIAGNOSIS — J9621 Acute and chronic respiratory failure with hypoxia: Secondary | ICD-10-CM | POA: Diagnosis not present

## 2020-03-07 DIAGNOSIS — N400 Enlarged prostate without lower urinary tract symptoms: Secondary | ICD-10-CM | POA: Diagnosis not present

## 2020-03-07 DIAGNOSIS — Z9981 Dependence on supplemental oxygen: Secondary | ICD-10-CM | POA: Diagnosis not present

## 2020-03-07 DIAGNOSIS — Z6829 Body mass index (BMI) 29.0-29.9, adult: Secondary | ICD-10-CM | POA: Diagnosis not present

## 2020-03-07 DIAGNOSIS — Z8701 Personal history of pneumonia (recurrent): Secondary | ICD-10-CM | POA: Diagnosis not present

## 2020-03-07 DIAGNOSIS — F419 Anxiety disorder, unspecified: Secondary | ICD-10-CM | POA: Diagnosis not present

## 2020-03-07 DIAGNOSIS — J449 Chronic obstructive pulmonary disease, unspecified: Secondary | ICD-10-CM | POA: Diagnosis not present

## 2020-03-07 DIAGNOSIS — Z8616 Personal history of COVID-19: Secondary | ICD-10-CM | POA: Diagnosis not present

## 2020-03-07 DIAGNOSIS — Z8673 Personal history of transient ischemic attack (TIA), and cerebral infarction without residual deficits: Secondary | ICD-10-CM | POA: Diagnosis not present

## 2020-03-17 DIAGNOSIS — N400 Enlarged prostate without lower urinary tract symptoms: Secondary | ICD-10-CM | POA: Diagnosis not present

## 2020-03-17 DIAGNOSIS — F419 Anxiety disorder, unspecified: Secondary | ICD-10-CM | POA: Diagnosis not present

## 2020-03-17 DIAGNOSIS — E663 Overweight: Secondary | ICD-10-CM | POA: Diagnosis not present

## 2020-03-17 DIAGNOSIS — Z87891 Personal history of nicotine dependence: Secondary | ICD-10-CM | POA: Diagnosis not present

## 2020-03-17 DIAGNOSIS — Z6829 Body mass index (BMI) 29.0-29.9, adult: Secondary | ICD-10-CM | POA: Diagnosis not present

## 2020-03-17 DIAGNOSIS — Z9981 Dependence on supplemental oxygen: Secondary | ICD-10-CM | POA: Diagnosis not present

## 2020-03-17 DIAGNOSIS — J9621 Acute and chronic respiratory failure with hypoxia: Secondary | ICD-10-CM | POA: Diagnosis not present

## 2020-03-17 DIAGNOSIS — I10 Essential (primary) hypertension: Secondary | ICD-10-CM | POA: Diagnosis not present

## 2020-03-17 DIAGNOSIS — Z8616 Personal history of COVID-19: Secondary | ICD-10-CM | POA: Diagnosis not present

## 2020-03-17 DIAGNOSIS — Z8701 Personal history of pneumonia (recurrent): Secondary | ICD-10-CM | POA: Diagnosis not present

## 2020-03-17 DIAGNOSIS — Z8673 Personal history of transient ischemic attack (TIA), and cerebral infarction without residual deficits: Secondary | ICD-10-CM | POA: Diagnosis not present

## 2020-03-17 DIAGNOSIS — J449 Chronic obstructive pulmonary disease, unspecified: Secondary | ICD-10-CM | POA: Diagnosis not present

## 2020-03-17 DIAGNOSIS — E782 Mixed hyperlipidemia: Secondary | ICD-10-CM | POA: Diagnosis not present

## 2020-03-19 DIAGNOSIS — J849 Interstitial pulmonary disease, unspecified: Secondary | ICD-10-CM | POA: Diagnosis not present

## 2020-03-19 DIAGNOSIS — J984 Other disorders of lung: Secondary | ICD-10-CM | POA: Diagnosis not present

## 2020-04-04 DIAGNOSIS — U071 COVID-19: Secondary | ICD-10-CM | POA: Diagnosis not present

## 2020-04-04 DIAGNOSIS — J449 Chronic obstructive pulmonary disease, unspecified: Secondary | ICD-10-CM | POA: Diagnosis not present

## 2020-04-09 DIAGNOSIS — J9611 Chronic respiratory failure with hypoxia: Secondary | ICD-10-CM | POA: Diagnosis not present

## 2020-04-09 DIAGNOSIS — R0600 Dyspnea, unspecified: Secondary | ICD-10-CM | POA: Diagnosis not present

## 2020-04-09 DIAGNOSIS — G4733 Obstructive sleep apnea (adult) (pediatric): Secondary | ICD-10-CM | POA: Diagnosis not present

## 2020-04-09 DIAGNOSIS — J849 Interstitial pulmonary disease, unspecified: Secondary | ICD-10-CM | POA: Diagnosis not present

## 2020-04-18 DIAGNOSIS — J8410 Pulmonary fibrosis, unspecified: Secondary | ICD-10-CM | POA: Diagnosis not present

## 2020-04-18 DIAGNOSIS — J849 Interstitial pulmonary disease, unspecified: Secondary | ICD-10-CM | POA: Diagnosis not present

## 2020-05-01 DIAGNOSIS — Z87891 Personal history of nicotine dependence: Secondary | ICD-10-CM | POA: Diagnosis not present

## 2020-05-01 DIAGNOSIS — J449 Chronic obstructive pulmonary disease, unspecified: Secondary | ICD-10-CM | POA: Diagnosis not present

## 2020-05-01 DIAGNOSIS — J984 Other disorders of lung: Secondary | ICD-10-CM | POA: Diagnosis not present

## 2020-05-03 DIAGNOSIS — M7662 Achilles tendinitis, left leg: Secondary | ICD-10-CM | POA: Diagnosis not present

## 2020-05-03 DIAGNOSIS — M7752 Other enthesopathy of left foot: Secondary | ICD-10-CM | POA: Diagnosis not present

## 2020-05-03 DIAGNOSIS — M10071 Idiopathic gout, right ankle and foot: Secondary | ICD-10-CM | POA: Diagnosis not present

## 2020-05-05 DIAGNOSIS — U071 COVID-19: Secondary | ICD-10-CM | POA: Diagnosis not present

## 2020-05-05 DIAGNOSIS — J449 Chronic obstructive pulmonary disease, unspecified: Secondary | ICD-10-CM | POA: Diagnosis not present

## 2020-05-17 DIAGNOSIS — M7752 Other enthesopathy of left foot: Secondary | ICD-10-CM | POA: Diagnosis not present

## 2020-05-17 DIAGNOSIS — M7662 Achilles tendinitis, left leg: Secondary | ICD-10-CM | POA: Diagnosis not present

## 2020-05-17 DIAGNOSIS — M10071 Idiopathic gout, right ankle and foot: Secondary | ICD-10-CM | POA: Diagnosis not present

## 2020-05-18 DIAGNOSIS — J849 Interstitial pulmonary disease, unspecified: Secondary | ICD-10-CM | POA: Diagnosis not present

## 2020-06-04 DIAGNOSIS — U071 COVID-19: Secondary | ICD-10-CM | POA: Diagnosis not present

## 2020-06-04 DIAGNOSIS — J449 Chronic obstructive pulmonary disease, unspecified: Secondary | ICD-10-CM | POA: Diagnosis not present

## 2020-06-18 DIAGNOSIS — J849 Interstitial pulmonary disease, unspecified: Secondary | ICD-10-CM | POA: Diagnosis not present

## 2020-06-21 DIAGNOSIS — J849 Interstitial pulmonary disease, unspecified: Secondary | ICD-10-CM | POA: Diagnosis not present

## 2020-06-21 DIAGNOSIS — G4733 Obstructive sleep apnea (adult) (pediatric): Secondary | ICD-10-CM | POA: Diagnosis not present

## 2020-06-21 DIAGNOSIS — J9611 Chronic respiratory failure with hypoxia: Secondary | ICD-10-CM | POA: Diagnosis not present

## 2020-06-21 DIAGNOSIS — J449 Chronic obstructive pulmonary disease, unspecified: Secondary | ICD-10-CM | POA: Diagnosis not present

## 2020-06-22 DIAGNOSIS — G4733 Obstructive sleep apnea (adult) (pediatric): Secondary | ICD-10-CM | POA: Diagnosis not present

## 2020-06-25 DIAGNOSIS — F330 Major depressive disorder, recurrent, mild: Secondary | ICD-10-CM | POA: Diagnosis not present

## 2020-06-25 DIAGNOSIS — M8949 Other hypertrophic osteoarthropathy, multiple sites: Secondary | ICD-10-CM | POA: Diagnosis not present

## 2020-06-25 DIAGNOSIS — I10 Essential (primary) hypertension: Secondary | ICD-10-CM | POA: Diagnosis not present

## 2020-06-25 DIAGNOSIS — I700 Atherosclerosis of aorta: Secondary | ICD-10-CM | POA: Diagnosis not present

## 2020-06-25 DIAGNOSIS — J9611 Chronic respiratory failure with hypoxia: Secondary | ICD-10-CM | POA: Diagnosis not present

## 2020-06-25 DIAGNOSIS — J449 Chronic obstructive pulmonary disease, unspecified: Secondary | ICD-10-CM | POA: Diagnosis not present

## 2020-06-25 DIAGNOSIS — E782 Mixed hyperlipidemia: Secondary | ICD-10-CM | POA: Diagnosis not present

## 2020-06-25 DIAGNOSIS — R7303 Prediabetes: Secondary | ICD-10-CM | POA: Diagnosis not present

## 2020-06-25 DIAGNOSIS — Z8616 Personal history of COVID-19: Secondary | ICD-10-CM | POA: Diagnosis not present

## 2020-07-05 DIAGNOSIS — U071 COVID-19: Secondary | ICD-10-CM | POA: Diagnosis not present

## 2020-07-05 DIAGNOSIS — J449 Chronic obstructive pulmonary disease, unspecified: Secondary | ICD-10-CM | POA: Diagnosis not present

## 2020-08-05 DIAGNOSIS — J449 Chronic obstructive pulmonary disease, unspecified: Secondary | ICD-10-CM | POA: Diagnosis not present

## 2020-08-05 DIAGNOSIS — U071 COVID-19: Secondary | ICD-10-CM | POA: Diagnosis not present

## 2020-09-04 DIAGNOSIS — J449 Chronic obstructive pulmonary disease, unspecified: Secondary | ICD-10-CM | POA: Diagnosis not present

## 2020-09-04 DIAGNOSIS — U071 COVID-19: Secondary | ICD-10-CM | POA: Diagnosis not present

## 2020-10-05 DIAGNOSIS — U071 COVID-19: Secondary | ICD-10-CM | POA: Diagnosis not present

## 2020-10-05 DIAGNOSIS — J449 Chronic obstructive pulmonary disease, unspecified: Secondary | ICD-10-CM | POA: Diagnosis not present

## 2020-11-04 DIAGNOSIS — U071 COVID-19: Secondary | ICD-10-CM | POA: Diagnosis not present

## 2020-11-04 DIAGNOSIS — J449 Chronic obstructive pulmonary disease, unspecified: Secondary | ICD-10-CM | POA: Diagnosis not present

## 2020-11-23 DIAGNOSIS — G4733 Obstructive sleep apnea (adult) (pediatric): Secondary | ICD-10-CM | POA: Diagnosis not present

## 2020-11-23 DIAGNOSIS — J449 Chronic obstructive pulmonary disease, unspecified: Secondary | ICD-10-CM | POA: Diagnosis not present

## 2020-11-23 DIAGNOSIS — U071 COVID-19: Secondary | ICD-10-CM | POA: Diagnosis not present

## 2020-11-30 DIAGNOSIS — Z20828 Contact with and (suspected) exposure to other viral communicable diseases: Secondary | ICD-10-CM | POA: Diagnosis not present

## 2020-12-18 DIAGNOSIS — J432 Centrilobular emphysema: Secondary | ICD-10-CM | POA: Diagnosis not present

## 2020-12-18 DIAGNOSIS — Z23 Encounter for immunization: Secondary | ICD-10-CM | POA: Diagnosis not present

## 2020-12-18 DIAGNOSIS — J849 Interstitial pulmonary disease, unspecified: Secondary | ICD-10-CM | POA: Diagnosis not present

## 2020-12-18 DIAGNOSIS — Z87891 Personal history of nicotine dependence: Secondary | ICD-10-CM | POA: Diagnosis not present

## 2020-12-18 DIAGNOSIS — G4733 Obstructive sleep apnea (adult) (pediatric): Secondary | ICD-10-CM | POA: Diagnosis not present

## 2020-12-18 DIAGNOSIS — J9611 Chronic respiratory failure with hypoxia: Secondary | ICD-10-CM | POA: Diagnosis not present

## 2020-12-24 DIAGNOSIS — J449 Chronic obstructive pulmonary disease, unspecified: Secondary | ICD-10-CM | POA: Diagnosis not present

## 2020-12-24 DIAGNOSIS — G4733 Obstructive sleep apnea (adult) (pediatric): Secondary | ICD-10-CM | POA: Diagnosis not present

## 2020-12-24 DIAGNOSIS — U071 COVID-19: Secondary | ICD-10-CM | POA: Diagnosis not present

## 2021-01-01 DIAGNOSIS — F330 Major depressive disorder, recurrent, mild: Secondary | ICD-10-CM | POA: Diagnosis not present

## 2021-01-01 DIAGNOSIS — M5136 Other intervertebral disc degeneration, lumbar region: Secondary | ICD-10-CM | POA: Diagnosis not present

## 2021-01-01 DIAGNOSIS — G4733 Obstructive sleep apnea (adult) (pediatric): Secondary | ICD-10-CM | POA: Diagnosis not present

## 2021-01-01 DIAGNOSIS — I10 Essential (primary) hypertension: Secondary | ICD-10-CM | POA: Diagnosis not present

## 2021-01-01 DIAGNOSIS — K2270 Barrett's esophagus without dysplasia: Secondary | ICD-10-CM | POA: Diagnosis not present

## 2021-01-01 DIAGNOSIS — Z0000 Encounter for general adult medical examination without abnormal findings: Secondary | ICD-10-CM | POA: Diagnosis not present

## 2021-01-01 DIAGNOSIS — J432 Centrilobular emphysema: Secondary | ICD-10-CM | POA: Diagnosis not present

## 2021-01-01 DIAGNOSIS — I700 Atherosclerosis of aorta: Secondary | ICD-10-CM | POA: Diagnosis not present

## 2021-01-01 DIAGNOSIS — E782 Mixed hyperlipidemia: Secondary | ICD-10-CM | POA: Diagnosis not present

## 2021-01-01 DIAGNOSIS — I2510 Atherosclerotic heart disease of native coronary artery without angina pectoris: Secondary | ICD-10-CM | POA: Diagnosis not present

## 2021-01-01 DIAGNOSIS — M8949 Other hypertrophic osteoarthropathy, multiple sites: Secondary | ICD-10-CM | POA: Diagnosis not present

## 2021-01-01 DIAGNOSIS — J849 Interstitial pulmonary disease, unspecified: Secondary | ICD-10-CM | POA: Diagnosis not present

## 2021-01-01 DIAGNOSIS — Z23 Encounter for immunization: Secondary | ICD-10-CM | POA: Diagnosis not present

## 2021-01-02 DIAGNOSIS — I10 Essential (primary) hypertension: Secondary | ICD-10-CM | POA: Diagnosis not present

## 2021-01-02 DIAGNOSIS — Z125 Encounter for screening for malignant neoplasm of prostate: Secondary | ICD-10-CM | POA: Diagnosis not present

## 2021-01-02 DIAGNOSIS — R7303 Prediabetes: Secondary | ICD-10-CM | POA: Diagnosis not present

## 2021-01-02 DIAGNOSIS — E782 Mixed hyperlipidemia: Secondary | ICD-10-CM | POA: Diagnosis not present

## 2021-01-05 DIAGNOSIS — U071 COVID-19: Secondary | ICD-10-CM | POA: Diagnosis not present

## 2021-01-05 DIAGNOSIS — J449 Chronic obstructive pulmonary disease, unspecified: Secondary | ICD-10-CM | POA: Diagnosis not present

## 2021-01-15 DIAGNOSIS — G4733 Obstructive sleep apnea (adult) (pediatric): Secondary | ICD-10-CM | POA: Diagnosis not present

## 2021-01-15 DIAGNOSIS — U071 COVID-19: Secondary | ICD-10-CM | POA: Diagnosis not present

## 2021-01-15 DIAGNOSIS — J449 Chronic obstructive pulmonary disease, unspecified: Secondary | ICD-10-CM | POA: Diagnosis not present

## 2021-01-21 DIAGNOSIS — U071 COVID-19: Secondary | ICD-10-CM | POA: Diagnosis not present

## 2021-01-21 DIAGNOSIS — G4733 Obstructive sleep apnea (adult) (pediatric): Secondary | ICD-10-CM | POA: Diagnosis not present

## 2021-01-21 DIAGNOSIS — J449 Chronic obstructive pulmonary disease, unspecified: Secondary | ICD-10-CM | POA: Diagnosis not present

## 2021-02-02 DIAGNOSIS — J449 Chronic obstructive pulmonary disease, unspecified: Secondary | ICD-10-CM | POA: Diagnosis not present

## 2021-02-02 DIAGNOSIS — U071 COVID-19: Secondary | ICD-10-CM | POA: Diagnosis not present

## 2021-02-19 DIAGNOSIS — G4733 Obstructive sleep apnea (adult) (pediatric): Secondary | ICD-10-CM | POA: Diagnosis not present

## 2021-02-19 DIAGNOSIS — J449 Chronic obstructive pulmonary disease, unspecified: Secondary | ICD-10-CM | POA: Diagnosis not present

## 2021-02-21 DIAGNOSIS — G4733 Obstructive sleep apnea (adult) (pediatric): Secondary | ICD-10-CM | POA: Diagnosis not present

## 2021-02-21 DIAGNOSIS — J449 Chronic obstructive pulmonary disease, unspecified: Secondary | ICD-10-CM | POA: Diagnosis not present

## 2021-02-21 DIAGNOSIS — U071 COVID-19: Secondary | ICD-10-CM | POA: Diagnosis not present

## 2021-03-04 DIAGNOSIS — M1711 Unilateral primary osteoarthritis, right knee: Secondary | ICD-10-CM | POA: Diagnosis not present

## 2021-03-04 DIAGNOSIS — M170 Bilateral primary osteoarthritis of knee: Secondary | ICD-10-CM | POA: Diagnosis not present

## 2021-03-04 DIAGNOSIS — M1712 Unilateral primary osteoarthritis, left knee: Secondary | ICD-10-CM | POA: Diagnosis not present

## 2021-03-05 DIAGNOSIS — J449 Chronic obstructive pulmonary disease, unspecified: Secondary | ICD-10-CM | POA: Diagnosis not present

## 2021-03-05 DIAGNOSIS — U071 COVID-19: Secondary | ICD-10-CM | POA: Diagnosis not present

## 2021-03-23 DIAGNOSIS — J449 Chronic obstructive pulmonary disease, unspecified: Secondary | ICD-10-CM | POA: Diagnosis not present

## 2021-03-23 DIAGNOSIS — G4733 Obstructive sleep apnea (adult) (pediatric): Secondary | ICD-10-CM | POA: Diagnosis not present

## 2021-03-23 DIAGNOSIS — U071 COVID-19: Secondary | ICD-10-CM | POA: Diagnosis not present

## 2021-03-26 DIAGNOSIS — R0683 Snoring: Secondary | ICD-10-CM | POA: Diagnosis not present

## 2021-03-26 DIAGNOSIS — G4730 Sleep apnea, unspecified: Secondary | ICD-10-CM | POA: Diagnosis not present

## 2021-04-09 DIAGNOSIS — G4733 Obstructive sleep apnea (adult) (pediatric): Secondary | ICD-10-CM | POA: Diagnosis not present

## 2021-04-23 DIAGNOSIS — U071 COVID-19: Secondary | ICD-10-CM | POA: Diagnosis not present

## 2021-04-23 DIAGNOSIS — G4733 Obstructive sleep apnea (adult) (pediatric): Secondary | ICD-10-CM | POA: Diagnosis not present

## 2021-04-23 DIAGNOSIS — J449 Chronic obstructive pulmonary disease, unspecified: Secondary | ICD-10-CM | POA: Diagnosis not present

## 2021-04-26 DIAGNOSIS — M170 Bilateral primary osteoarthritis of knee: Secondary | ICD-10-CM | POA: Diagnosis not present

## 2021-05-01 DIAGNOSIS — M170 Bilateral primary osteoarthritis of knee: Secondary | ICD-10-CM | POA: Diagnosis not present

## 2021-05-01 DIAGNOSIS — M25562 Pain in left knee: Secondary | ICD-10-CM | POA: Diagnosis not present

## 2021-05-01 DIAGNOSIS — M25561 Pain in right knee: Secondary | ICD-10-CM | POA: Diagnosis not present

## 2021-05-08 DIAGNOSIS — M170 Bilateral primary osteoarthritis of knee: Secondary | ICD-10-CM | POA: Diagnosis not present

## 2021-05-10 DIAGNOSIS — G4733 Obstructive sleep apnea (adult) (pediatric): Secondary | ICD-10-CM | POA: Diagnosis not present

## 2021-05-23 DIAGNOSIS — U071 COVID-19: Secondary | ICD-10-CM | POA: Diagnosis not present

## 2021-05-23 DIAGNOSIS — J849 Interstitial pulmonary disease, unspecified: Secondary | ICD-10-CM | POA: Diagnosis not present

## 2021-05-23 DIAGNOSIS — J432 Centrilobular emphysema: Secondary | ICD-10-CM | POA: Diagnosis not present

## 2021-05-23 DIAGNOSIS — J449 Chronic obstructive pulmonary disease, unspecified: Secondary | ICD-10-CM | POA: Diagnosis not present

## 2021-05-23 DIAGNOSIS — G4733 Obstructive sleep apnea (adult) (pediatric): Secondary | ICD-10-CM | POA: Diagnosis not present

## 2021-06-09 DIAGNOSIS — G4733 Obstructive sleep apnea (adult) (pediatric): Secondary | ICD-10-CM | POA: Diagnosis not present

## 2021-06-21 DIAGNOSIS — M170 Bilateral primary osteoarthritis of knee: Secondary | ICD-10-CM | POA: Diagnosis not present

## 2021-06-23 DIAGNOSIS — G4733 Obstructive sleep apnea (adult) (pediatric): Secondary | ICD-10-CM | POA: Diagnosis not present

## 2021-06-23 DIAGNOSIS — U071 COVID-19: Secondary | ICD-10-CM | POA: Diagnosis not present

## 2021-06-23 DIAGNOSIS — J449 Chronic obstructive pulmonary disease, unspecified: Secondary | ICD-10-CM | POA: Diagnosis not present

## 2021-07-10 DIAGNOSIS — G4733 Obstructive sleep apnea (adult) (pediatric): Secondary | ICD-10-CM | POA: Diagnosis not present

## 2021-07-12 DIAGNOSIS — R7303 Prediabetes: Secondary | ICD-10-CM | POA: Diagnosis not present

## 2021-07-12 DIAGNOSIS — F330 Major depressive disorder, recurrent, mild: Secondary | ICD-10-CM | POA: Diagnosis not present

## 2021-07-12 DIAGNOSIS — M5136 Other intervertebral disc degeneration, lumbar region: Secondary | ICD-10-CM | POA: Diagnosis not present

## 2021-07-12 DIAGNOSIS — G4733 Obstructive sleep apnea (adult) (pediatric): Secondary | ICD-10-CM | POA: Diagnosis not present

## 2021-07-12 DIAGNOSIS — E6609 Other obesity due to excess calories: Secondary | ICD-10-CM | POA: Diagnosis not present

## 2021-07-12 DIAGNOSIS — J849 Interstitial pulmonary disease, unspecified: Secondary | ICD-10-CM | POA: Diagnosis not present

## 2021-07-12 DIAGNOSIS — I700 Atherosclerosis of aorta: Secondary | ICD-10-CM | POA: Diagnosis not present

## 2021-07-12 DIAGNOSIS — M170 Bilateral primary osteoarthritis of knee: Secondary | ICD-10-CM | POA: Diagnosis not present

## 2021-07-12 DIAGNOSIS — M159 Polyosteoarthritis, unspecified: Secondary | ICD-10-CM | POA: Diagnosis not present

## 2021-07-12 DIAGNOSIS — I10 Essential (primary) hypertension: Secondary | ICD-10-CM | POA: Diagnosis not present

## 2021-07-12 DIAGNOSIS — J432 Centrilobular emphysema: Secondary | ICD-10-CM | POA: Diagnosis not present

## 2021-07-12 DIAGNOSIS — F5104 Psychophysiologic insomnia: Secondary | ICD-10-CM | POA: Diagnosis not present

## 2021-07-12 DIAGNOSIS — Z6832 Body mass index (BMI) 32.0-32.9, adult: Secondary | ICD-10-CM | POA: Diagnosis not present

## 2021-07-24 DIAGNOSIS — G4733 Obstructive sleep apnea (adult) (pediatric): Secondary | ICD-10-CM | POA: Diagnosis not present

## 2021-07-24 DIAGNOSIS — J449 Chronic obstructive pulmonary disease, unspecified: Secondary | ICD-10-CM | POA: Diagnosis not present

## 2021-07-24 DIAGNOSIS — U071 COVID-19: Secondary | ICD-10-CM | POA: Diagnosis not present

## 2021-07-25 DIAGNOSIS — G4733 Obstructive sleep apnea (adult) (pediatric): Secondary | ICD-10-CM | POA: Diagnosis not present

## 2021-07-25 DIAGNOSIS — Z6832 Body mass index (BMI) 32.0-32.9, adult: Secondary | ICD-10-CM | POA: Diagnosis not present

## 2021-08-05 DIAGNOSIS — M170 Bilateral primary osteoarthritis of knee: Secondary | ICD-10-CM | POA: Diagnosis not present

## 2021-08-09 ENCOUNTER — Other Ambulatory Visit: Payer: Self-pay | Admitting: Otolaryngology

## 2021-08-09 ENCOUNTER — Other Ambulatory Visit: Payer: Self-pay

## 2021-08-09 ENCOUNTER — Encounter (HOSPITAL_BASED_OUTPATIENT_CLINIC_OR_DEPARTMENT_OTHER): Payer: Self-pay | Admitting: Otolaryngology

## 2021-08-09 NOTE — Progress Notes (Signed)
Carla at Dr Jenne Pane office called to see if Dr Jenne Pane wants patient to stop his Plavix before his procedure. She states she will text him then let me know.

## 2021-08-10 DIAGNOSIS — G4733 Obstructive sleep apnea (adult) (pediatric): Secondary | ICD-10-CM | POA: Diagnosis not present

## 2021-08-12 NOTE — Progress Notes (Signed)
Matthew Sharp, surgery coordinator at [facility], called to say that patient does not have to stop Plavix prior to sx per Dr. Jenne Pane.

## 2021-08-14 ENCOUNTER — Encounter (HOSPITAL_BASED_OUTPATIENT_CLINIC_OR_DEPARTMENT_OTHER)
Admission: RE | Disposition: A | Discharge status: Home / Self Care | Payer: Self-pay | Source: Home / Self Care | Attending: Otolaryngology

## 2021-08-14 ENCOUNTER — Ambulatory Visit (HOSPITAL_BASED_OUTPATIENT_CLINIC_OR_DEPARTMENT_OTHER): Payer: PPO | Admitting: Anesthesiology

## 2021-08-14 ENCOUNTER — Other Ambulatory Visit: Payer: Self-pay

## 2021-08-14 ENCOUNTER — Ambulatory Visit (HOSPITAL_BASED_OUTPATIENT_CLINIC_OR_DEPARTMENT_OTHER)
Admission: RE | Admit: 2021-08-14 | Discharge: 2021-08-14 | Disposition: A | Discharge status: Home / Self Care | Payer: PPO | Attending: Otolaryngology | Admitting: Otolaryngology

## 2021-08-14 ENCOUNTER — Encounter (HOSPITAL_BASED_OUTPATIENT_CLINIC_OR_DEPARTMENT_OTHER): Payer: Self-pay | Admitting: Otolaryngology

## 2021-08-14 DIAGNOSIS — Z881 Allergy status to other antibiotic agents status: Secondary | ICD-10-CM | POA: Insufficient documentation

## 2021-08-14 DIAGNOSIS — G4733 Obstructive sleep apnea (adult) (pediatric): Secondary | ICD-10-CM | POA: Diagnosis not present

## 2021-08-14 DIAGNOSIS — U099 Post covid-19 condition, unspecified: Secondary | ICD-10-CM | POA: Insufficient documentation

## 2021-08-14 DIAGNOSIS — Z7951 Long term (current) use of inhaled steroids: Secondary | ICD-10-CM | POA: Insufficient documentation

## 2021-08-14 DIAGNOSIS — Z7902 Long term (current) use of antithrombotics/antiplatelets: Secondary | ICD-10-CM | POA: Insufficient documentation

## 2021-08-14 DIAGNOSIS — Z87891 Personal history of nicotine dependence: Secondary | ICD-10-CM | POA: Insufficient documentation

## 2021-08-14 DIAGNOSIS — Z888 Allergy status to other drugs, medicaments and biological substances status: Secondary | ICD-10-CM | POA: Insufficient documentation

## 2021-08-14 DIAGNOSIS — N400 Enlarged prostate without lower urinary tract symptoms: Secondary | ICD-10-CM | POA: Diagnosis not present

## 2021-08-14 DIAGNOSIS — J449 Chronic obstructive pulmonary disease, unspecified: Secondary | ICD-10-CM | POA: Diagnosis not present

## 2021-08-14 DIAGNOSIS — Z79899 Other long term (current) drug therapy: Secondary | ICD-10-CM | POA: Insufficient documentation

## 2021-08-14 DIAGNOSIS — J849 Interstitial pulmonary disease, unspecified: Secondary | ICD-10-CM | POA: Insufficient documentation

## 2021-08-14 DIAGNOSIS — Z8673 Personal history of transient ischemic attack (TIA), and cerebral infarction without residual deficits: Secondary | ICD-10-CM | POA: Diagnosis not present

## 2021-08-14 HISTORY — DX: Chronic obstructive pulmonary disease, unspecified: J44.9

## 2021-08-14 HISTORY — DX: Benign prostatic hyperplasia without lower urinary tract symptoms: N40.0

## 2021-08-14 HISTORY — PX: DRUG INDUCED ENDOSCOPY: SHX6808

## 2021-08-14 HISTORY — DX: Anxiety disorder, unspecified: F41.9

## 2021-08-14 HISTORY — DX: Interstitial pulmonary disease, unspecified: J84.9

## 2021-08-14 HISTORY — DX: Other chronic pain: G89.29

## 2021-08-14 HISTORY — DX: Depression, unspecified: F32.A

## 2021-08-14 HISTORY — DX: Cerebral infarction, unspecified: I63.9

## 2021-08-14 HISTORY — DX: Sleep apnea, unspecified: G47.30

## 2021-08-14 SURGERY — DRUG INDUCED SLEEP ENDOSCOPY
Anesthesia: Monitor Anesthesia Care | Site: Nose

## 2021-08-14 MED ORDER — FENTANYL CITRATE (PF) 100 MCG/2ML IJ SOLN: 25.0000 ug | INTRAMUSCULAR | Status: DC | PRN

## 2021-08-14 MED ORDER — PROPOFOL 500 MG/50ML IV EMUL
INTRAVENOUS | Status: DC | PRN
  Administered 2021-08-14: 35 ug/kg/min via INTRAVENOUS

## 2021-08-14 MED ORDER — OXYMETAZOLINE HCL 0.05 % NA SOLN
NASAL | Status: DC | PRN
  Administered 2021-08-14: 1 via TOPICAL

## 2021-08-14 MED ORDER — LACTATED RINGERS IV SOLN: INTRAVENOUS | Status: DC

## 2021-08-14 MED ORDER — LIDOCAINE HCL (CARDIAC) PF 100 MG/5ML IV SOSY
PREFILLED_SYRINGE | INTRAVENOUS | Status: DC | PRN
  Administered 2021-08-14: 60 mg via INTRAVENOUS

## 2021-08-14 MED ORDER — AMISULPRIDE (ANTIEMETIC) 5 MG/2ML IV SOLN: 10.0000 mg | Freq: Once | INTRAVENOUS | Status: DC | PRN

## 2021-08-14 SURGICAL SUPPLY — 15 items
CANISTER SUCT 1200ML W/VALVE (MISCELLANEOUS) ×2 IMPLANT
GLOVE SRG 8 PF TXTR STRL LF DI (GLOVE) IMPLANT
GLOVE SURG ENC MOIS LTX SZ7.5 (GLOVE) ×2 IMPLANT
GLOVE SURG UNDER POLY LF SZ8 (GLOVE) ×2
KIT CLEAN ENDO (MISCELLANEOUS) ×2 IMPLANT
NDL PRECISIONGLIDE 27X1.5 (NEEDLE) IMPLANT
NEEDLE PRECISIONGLIDE 27X1.5 (NEEDLE) IMPLANT
PACK BASIN DAY SURGERY FS (CUSTOM PROCEDURE TRAY) ×2 IMPLANT
PATTIES SURGICAL .5 X3 (DISPOSABLE) ×2 IMPLANT
SHEET MEDIUM DRAPE 40X70 STRL (DRAPES) IMPLANT
SOL ANTI FOG 6CC (MISCELLANEOUS) ×1 IMPLANT
SOLUTION ANTI FOG 6CC (MISCELLANEOUS) ×1
SYR CONTROL 10ML LL (SYRINGE) IMPLANT
TOWEL GREEN STERILE FF (TOWEL DISPOSABLE) ×2 IMPLANT
TUBE CONNECTING 20X1/4 (TUBING) ×2 IMPLANT

## 2021-08-14 NOTE — Discharge Instructions (Signed)

## 2021-08-14 NOTE — Transfer of Care (Signed)
Immediate Anesthesia Transfer of Care Note  Patient: Matthew Sharp  Procedure(s) Performed: DRUG INDUCED SLEEP ENDOSCOPY (Nose)  Patient Location: PACU  Anesthesia Type:MAC  Level of Consciousness: awake, alert , oriented and patient cooperative  Airway & Oxygen Therapy: Patient Spontanous Breathing and Patient connected to face mask oxygen  Post-op Assessment: Report given to RN and Post -op Vital signs reviewed and stable  Post vital signs: Reviewed and stable  Last Vitals:  Vitals Value Taken Time  BP 120/96 08/14/21 1030  Temp    Pulse 78 08/14/21 1031  Resp 18 08/14/21 1031  SpO2 100 % 08/14/21 1031  Vitals shown include unvalidated device data.  Last Pain:  Vitals:   08/14/21 0842  TempSrc: Oral  PainSc: 0-No pain         Complications: No notable events documented.

## 2021-08-14 NOTE — Anesthesia Postprocedure Evaluation (Signed)
Anesthesia Post Note  Patient: Matthew Sharp  Procedure(s) Performed: DRUG INDUCED SLEEP ENDOSCOPY (Nose)     Patient location during evaluation: PACU Anesthesia Type: MAC Level of consciousness: awake and alert Pain management: pain level controlled Vital Signs Assessment: post-procedure vital signs reviewed and stable Respiratory status: spontaneous breathing, nonlabored ventilation, respiratory function stable and patient connected to nasal cannula oxygen Cardiovascular status: stable and blood pressure returned to baseline Postop Assessment: no apparent nausea or vomiting Anesthetic complications: no   No notable events documented.  Last Vitals:  Vitals:   08/14/21 1045 08/14/21 1105  BP: 93/83 125/84  Pulse: 75 65  Resp: (!) 28 18  Temp:  36.7 C  SpO2: 97% 97%    Last Pain:  Vitals:   08/14/21 1105  TempSrc:   PainSc: 0-No pain                 Tiajuana Amass

## 2021-08-14 NOTE — Op Note (Signed)
Preop diagnosis: Obstructive sleep apnea Postop diagnosis: same Procedure: Drug-induced sleep endoscopy Surgeon: Jenne Pane Anesth: IV sedation Compl: None Findings: There is 50% anterior-posterior collapse at the velum making him a candidate for hypoglossal nerve stimulator placement.  There was also primary anterior-posterior collapse at the tongue base. Description:  After discussing risks, benefits, and alternatives, the patient was brought to the operative suite and placed on the operative table in the supine position.  Anesthesia was induced and the patient was given light sedation to simulate natural sleep. When the proper level was reached, an Afrin-soaked pledget was placed in the right nasal passage for a couple of minutes and then removed.  The fiberoptic laryngoscope was then passed to view the pharynx and larynx.  Findings are noted above and the exam was recorded.  After completion, the scope was removed and the patient was returned to anesthesia for wakeup and was moved to the recovery room in stable condition.

## 2021-08-14 NOTE — Anesthesia Preprocedure Evaluation (Addendum)
Anesthesia Evaluation  Patient identified by MRN, date of birth, ID band Patient awake    Reviewed: Allergy & Precautions, NPO status , Patient's Chart, lab work & pertinent test results  Airway Mallampati: II  TM Distance: >3 FB     Dental  (+) Dental Advisory Given   Pulmonary sleep apnea , COPD,  COPD inhaler, former smoker,  ILD post covid    breath sounds clear to auscultation       Cardiovascular negative cardio ROS   Rhythm:Regular Rate:Normal     Neuro/Psych CVA, No Residual Symptoms negative psych ROS   GI/Hepatic negative GI ROS, Neg liver ROS,   Endo/Other  negative endocrine ROS  Renal/GU negative Renal ROS  negative genitourinary   Musculoskeletal negative musculoskeletal ROS (+)   Abdominal   Peds negative pediatric ROS (+)  Hematology  (+) Blood dyscrasia (On plavix), ,   Anesthesia Other Findings   Reproductive/Obstetrics negative OB ROS                             Anesthesia Physical Anesthesia Plan  ASA: 3  Anesthesia Plan: MAC   Post-op Pain Management:    Induction:   PONV Risk Score and Plan: 1 and Propofol infusion, Ondansetron and Treatment may vary due to age or medical condition  Airway Management Planned: Natural Airway and Nasal Cannula  Additional Equipment: None  Intra-op Plan:   Post-operative Plan:   Informed Consent: I have reviewed the patients History and Physical, chart, labs and discussed the procedure including the risks, benefits and alternatives for the proposed anesthesia with the patient or authorized representative who has indicated his/her understanding and acceptance.       Plan Discussed with:   Anesthesia Plan Comments:         Anesthesia Quick Evaluation

## 2021-08-14 NOTE — Brief Op Note (Signed)
08/14/2021  10:26 AM  PATIENT:  Matthew Sharp  70 y.o. male  PRE-OPERATIVE DIAGNOSIS:  obstructive sleep apnea  POST-OPERATIVE DIAGNOSIS:  obstructive sleep apnea  PROCEDURE:  Procedure(s): DRUG INDUCED SLEEP ENDOSCOPY (N/A)  SURGEON:  Surgeon(s) and Role:    Christia Reading, MD - Primary  PHYSICIAN ASSISTANT:   ASSISTANTS: none   ANESTHESIA:   IV sedation  EBL:  None   BLOOD ADMINISTERED:none  DRAINS: none   LOCAL MEDICATIONS USED:  NONE  SPECIMEN:  No Specimen  DISPOSITION OF SPECIMEN:  N/A  COUNTS:  YES  TOURNIQUET:  * No tourniquets in log *  DICTATION: .Note written in EPIC  PLAN OF CARE: Discharge to home after PACU  PATIENT DISPOSITION:  PACU - hemodynamically stable.   Delay start of Pharmacological VTE agent (>24hrs) due to surgical blood loss or risk of bleeding: no

## 2021-08-14 NOTE — H&P (Signed)
Matthew Sharp is an 70 y.o. male.   Chief Complaint: Sleep apnea HPI: 70 year old male with sleep apnea who has been unable to tolerate CPAP.  Past Medical History:  Diagnosis Date   Anxiety    BPH (benign prostatic hyperplasia)    Chronic back pain    COPD (chronic obstructive pulmonary disease) (HCC)    Depression    Interstitial lung disease (HCC)    secondary to covid   Sleep apnea    does not use CPAP   Stroke (HCC)    on Plavix, no deficits    Past Surgical History:  Procedure Laterality Date   COLONOSCOPY      History reviewed. No pertinent family history. Social History:  reports that he has quit smoking. His smoking use included cigarettes. He has never used smokeless tobacco. He reports that he does not drink alcohol and does not use drugs.  Allergies:  Allergies  Allergen Reactions   Ciprofloxacin    Statins     Myalgias    Medications Prior to Admission  Medication Sig Dispense Refill   acetaminophen (TYLENOL) 500 MG tablet Take 1,000 mg by mouth every 6 (six) hours as needed.     albuterol (VENTOLIN HFA) 108 (90 Base) MCG/ACT inhaler Inhale into the lungs every 6 (six) hours as needed for wheezing or shortness of breath.     ALPRAZolam (XANAX) 0.5 MG tablet Take 0.5 mg by mouth at bedtime as needed for anxiety.     budesonide-formoterol (SYMBICORT) 160-4.5 MCG/ACT inhaler Inhale 2 puffs into the lungs 2 (two) times daily.     clopidogrel (PLAVIX) 75 MG tablet Take 75 mg by mouth daily.     furosemide (LASIX) 40 MG tablet Take 40 mg by mouth every other day.     tadalafil (CIALIS) 5 MG tablet Take 5 mg by mouth at bedtime.     tamsulosin (FLOMAX) 0.4 MG CAPS capsule Take 0.4 mg by mouth at bedtime.     traMADol (ULTRAM) 50 MG tablet Take 50-100 mg by mouth every 6 (six) hours as needed.      No results found for this or any previous visit (from the past 48 hour(s)). No results found.  Review of Systems  All other systems reviewed and are  negative.  Blood pressure 118/82, pulse 67, temperature 97.7 F (36.5 C), temperature source Oral, resp. rate 18, height 5\' 9"  (1.753 m), weight 95.8 kg, SpO2 99 %. Physical Exam Constitutional:      Appearance: Normal appearance. He is normal weight.  HENT:     Head: Normocephalic and atraumatic.     Right Ear: External ear normal.     Left Ear: External ear normal.     Nose: Nose normal.     Mouth/Throat:     Mouth: Mucous membranes are moist.     Pharynx: Oropharynx is clear.  Eyes:     Extraocular Movements: Extraocular movements intact.     Conjunctiva/sclera: Conjunctivae normal.     Pupils: Pupils are equal, round, and reactive to light.  Cardiovascular:     Rate and Rhythm: Normal rate.  Pulmonary:     Effort: Pulmonary effort is normal.  Musculoskeletal:     Cervical back: Normal range of motion.  Skin:    General: Skin is warm and dry.  Neurological:     General: No focal deficit present.     Mental Status: He is alert and oriented to person, place, and time.  Psychiatric:  Mood and Affect: Mood normal.        Behavior: Behavior normal.        Thought Content: Thought content normal.        Judgment: Judgment normal.     Assessment/Plan Sleep apnea  To OR for sleep endoscopy.  Christia Reading, MD 08/14/2021, 10:06 AM

## 2021-08-15 ENCOUNTER — Encounter (HOSPITAL_BASED_OUTPATIENT_CLINIC_OR_DEPARTMENT_OTHER): Payer: Self-pay | Admitting: Otolaryngology

## 2021-08-23 DIAGNOSIS — J449 Chronic obstructive pulmonary disease, unspecified: Secondary | ICD-10-CM | POA: Diagnosis not present

## 2021-08-23 DIAGNOSIS — U071 COVID-19: Secondary | ICD-10-CM | POA: Diagnosis not present

## 2021-08-23 DIAGNOSIS — G4733 Obstructive sleep apnea (adult) (pediatric): Secondary | ICD-10-CM | POA: Diagnosis not present

## 2021-09-09 DIAGNOSIS — R454 Irritability and anger: Secondary | ICD-10-CM | POA: Diagnosis not present

## 2021-09-09 DIAGNOSIS — G4733 Obstructive sleep apnea (adult) (pediatric): Secondary | ICD-10-CM | POA: Diagnosis not present

## 2021-09-09 DIAGNOSIS — M159 Polyosteoarthritis, unspecified: Secondary | ICD-10-CM | POA: Diagnosis not present

## 2021-09-09 DIAGNOSIS — M549 Dorsalgia, unspecified: Secondary | ICD-10-CM | POA: Diagnosis not present

## 2021-09-09 DIAGNOSIS — Z23 Encounter for immunization: Secondary | ICD-10-CM | POA: Diagnosis not present

## 2021-09-10 DIAGNOSIS — L918 Other hypertrophic disorders of the skin: Secondary | ICD-10-CM | POA: Diagnosis not present

## 2021-09-10 DIAGNOSIS — L739 Follicular disorder, unspecified: Secondary | ICD-10-CM | POA: Diagnosis not present

## 2021-09-10 DIAGNOSIS — L820 Inflamed seborrheic keratosis: Secondary | ICD-10-CM | POA: Diagnosis not present

## 2021-09-23 DIAGNOSIS — J449 Chronic obstructive pulmonary disease, unspecified: Secondary | ICD-10-CM | POA: Diagnosis not present

## 2021-09-23 DIAGNOSIS — G4733 Obstructive sleep apnea (adult) (pediatric): Secondary | ICD-10-CM | POA: Diagnosis not present

## 2021-09-23 DIAGNOSIS — U071 COVID-19: Secondary | ICD-10-CM | POA: Diagnosis not present

## 2021-10-10 DIAGNOSIS — G4733 Obstructive sleep apnea (adult) (pediatric): Secondary | ICD-10-CM | POA: Diagnosis not present

## 2021-10-17 ENCOUNTER — Other Ambulatory Visit: Payer: Self-pay | Admitting: Otolaryngology

## 2021-11-14 NOTE — Pre-Procedure Instructions (Signed)
Surgical Instructions    Your procedure is scheduled on Wednesday 11/20/21.   Report to [facility] Main Entrance "A" at 08:00 A.M., then check in with the Admitting office.  Call this number if you have problems the morning of surgery:  [phone]   If you have any questions prior to your surgery date call [phone]: Open Monday-Friday 8am-4pm    Remember:  Do not eat or drink after midnight the night before your surgery     Take these medicines the morning of surgery with A SIP OF WATER  budesonide-formoterol (SYMBICORT) sertraline (ZOLOFT)    Take these medicines if needed:   acetaminophen (TYLENOL)   albuterol (VENTOLIN HFA)   traMADol (ULTRAM)  Please follow your surgeon's instructions regarding clopidogrel (PLAVIX). If you have not received instructions then please contact your surgeon's office for instructions.   As of today, STOP taking any Aspirin (unless otherwise instructed by your surgeon) Aleve, Naproxen, Ibuprofen, Motrin, Advil, Goody's, BC's, all herbal medications, fish oil, and all vitamins.                     Do NOT Smoke (Tobacco/Vaping) or drink Alcohol 24 hours prior to your procedure.  If you use a CPAP at night, you may bring all equipment for your overnight stay.   Contacts, glasses, piercing's, hearing aid's, dentures or partials may not be worn into surgery, please bring cases for these belongings.    For patients admitted to the hospital, discharge time will be determined by your treatment team.   Patients discharged the day of surgery will not be allowed to drive home, and someone needs to stay with them for 24 hours.  NO VISITORS WILL BE ALLOWED IN PRE-OP WHERE PATIENTS GET READY FOR SURGERY.  ONLY 1 SUPPORT PERSON MAY BE PRESENT IN THE WAITING ROOM WHILE YOU ARE IN SURGERY.  IF YOU ARE TO BE ADMITTED, ONCE YOU ARE IN YOUR ROOM YOU WILL BE ALLOWED TWO (2) VISITORS.  Minor children may have two parents present. Special consideration for safety  and communication needs will be reviewed on a case by case basis.   Special instructions:   [place]- Preparing For Surgery  Before surgery, you can play an important role. Because skin is not sterile, your skin needs to be as free of germs as possible. You can reduce the number of germs on your skin by washing with CHG (chlorahexidine gluconate) Soap before surgery.  CHG is an antiseptic cleaner which kills germs and bonds with the skin to continue killing germs even after washing.    Oral Hygiene is also important to reduce your risk of infection.  Remember - BRUSH YOUR TEETH THE MORNING OF SURGERY WITH YOUR REGULAR TOOTHPASTE  Please do not use if you have an allergy to CHG or antibacterial soaps. If your skin becomes reddened/irritated stop using the CHG.  Do not shave (including legs and underarms) for at least 48 hours prior to first CHG shower. It is OK to shave your face.  Please follow these instructions carefully.   Shower the NIGHT BEFORE SURGERY and the MORNING OF SURGERY  If you chose to wash your hair, wash your hair first as usual with your normal shampoo.  After you shampoo, rinse your hair and body thoroughly to remove the shampoo.  Use CHG Soap as you would any other liquid soap. You can apply CHG directly to the skin and wash gently with a scrungie or a clean washcloth.   Apply  the CHG Soap to your body ONLY FROM THE NECK DOWN.  Do not use on open wounds or open sores. Avoid contact with your eyes, ears, mouth and genitals (private parts). Wash Face and genitals (private parts)  with your normal soap.   Wash thoroughly, paying special attention to the area where your surgery will be performed.  Thoroughly rinse your body with warm water from the neck down.  DO NOT shower/wash with your normal soap after using and rinsing off the CHG Soap.  Pat yourself dry with a CLEAN TOWEL.  Wear CLEAN PAJAMAS to bed the night before surgery  Place CLEAN SHEETS on your bed  the night before your surgery  DO NOT SLEEP WITH PETS.   Day of Surgery: Shower with CHG soap. Do not wear jewelry, make up, nail polish, gel polish, artificial nails, or any other type of covering on natural nails including finger and toenails. If patients have artificial nails, gel coating, etc. that need to be removed by a nail salon please have this removed prior to surgery. Surgery may need to be canceled/delayed if the surgeon/ anesthesia feels like the patient is unable to be adequately monitored. Do not wear lotions, powders, perfumes/colognes, or deodorant. Do not shave 48 hours prior to surgery.  Men may shave face and neck. Do not bring valuables to the hospital. [facility] is not responsible for any belongings or valuables. Wear Clean/Comfortable clothing the morning of surgery Remember to brush your teeth WITH YOUR REGULAR TOOTHPASTE.   Please read over the following fact sheets that you were given.   3 days prior to your procedure or After your COVID test   You are not required to quarantine however you are required to wear a well-fitting mask when you are out and around people not in your household. If your mask becomes wet or soiled, replace with a new one.   Wash your hands often with soap and water for 20 seconds or clean your hands with an alcohol-based hand sanitizer that contains at least 60% alcohol.   Do not share personal items.   Notify your provider:  o if you are in close contact with someone who has COVID  o or if you develop a fever of 100.4 or greater, sneezing, cough, sore throat, shortness of breath or body aches.

## 2021-11-19 ENCOUNTER — Encounter (HOSPITAL_COMMUNITY): Payer: Self-pay

## 2021-11-19 ENCOUNTER — Other Ambulatory Visit: Payer: Self-pay

## 2021-11-19 ENCOUNTER — Encounter (HOSPITAL_COMMUNITY)
Admission: RE | Admit: 2021-11-19 | Discharge: 2021-11-19 | Disposition: A | Discharge status: Home / Self Care | Payer: PPO | Source: Ambulatory Visit | Attending: Otolaryngology | Admitting: Otolaryngology

## 2021-11-19 VITALS — BP 119/74 | HR 70 | Temp 98.3°F | Resp 18 | Ht 70.0 in | Wt 207.0 lb

## 2021-11-19 DIAGNOSIS — Z01818 Encounter for other preprocedural examination: Secondary | ICD-10-CM

## 2021-11-19 DIAGNOSIS — G4733 Obstructive sleep apnea (adult) (pediatric): Secondary | ICD-10-CM | POA: Diagnosis not present

## 2021-11-19 DIAGNOSIS — Z01812 Encounter for preprocedural laboratory examination: Secondary | ICD-10-CM | POA: Insufficient documentation

## 2021-11-19 DIAGNOSIS — Z7902 Long term (current) use of antithrombotics/antiplatelets: Secondary | ICD-10-CM | POA: Diagnosis not present

## 2021-11-19 DIAGNOSIS — Z8616 Personal history of COVID-19: Secondary | ICD-10-CM | POA: Insufficient documentation

## 2021-11-19 DIAGNOSIS — Z20822 Contact with and (suspected) exposure to covid-19: Secondary | ICD-10-CM | POA: Insufficient documentation

## 2021-11-19 DIAGNOSIS — I10 Essential (primary) hypertension: Secondary | ICD-10-CM | POA: Diagnosis not present

## 2021-11-19 DIAGNOSIS — Z8673 Personal history of transient ischemic attack (TIA), and cerebral infarction without residual deficits: Secondary | ICD-10-CM | POA: Insufficient documentation

## 2021-11-19 HISTORY — DX: Pneumonia, unspecified organism: J18.9

## 2021-11-19 HISTORY — DX: Gastro-esophageal reflux disease without esophagitis: K21.9

## 2021-11-19 HISTORY — DX: Atherosclerotic heart disease of native coronary artery without angina pectoris: I25.10

## 2021-11-19 HISTORY — DX: Personal history of urinary calculi: Z87.442

## 2021-11-19 HISTORY — DX: Essential (primary) hypertension: I10

## 2021-11-19 LAB — BASIC METABOLIC PANEL
Anion gap: 10 (ref 5–15)
BUN: 13 mg/dL (ref 8–23)
CO2: 27 mmol/L (ref 22–32)
Calcium: 9.9 mg/dL (ref 8.9–10.3)
Chloride: 100 mmol/L (ref 98–111)
Creatinine, Ser: 1.24 mg/dL (ref 0.61–1.24)
GFR, Estimated: >60 mL/min (ref >60)
Glucose, Bld: 89 mg/dL (ref 70–99)
Potassium: 3.5 mmol/L (ref 3.5–5.1)
Sodium: 137 mmol/L (ref 135–145)

## 2021-11-19 LAB — CBC
HCT: 48.3 % (ref 39.0–52.0)
Hemoglobin: 16.4 g/dL (ref 13.0–17.0)
MCH: 30.2 pg (ref 26.0–34.0)
MCHC: 34 g/dL (ref 30.0–36.0)
MCV: 89 fL (ref 80.0–100.0)
Platelets: 327 10*3/uL (ref 150–400)
RBC: 5.43 MIL/uL (ref 4.22–5.81)
RDW: 12.9 % (ref 11.5–15.5)
WBC: 11.2 10*3/uL — ABNORMAL HIGH (ref 4.0–10.5)
nRBC: 0 % (ref 0.0–0.2)

## 2021-11-19 LAB — SARS CORONAVIRUS 2 (TAT 6-24 HRS): SARS Coronavirus 2: NEGATIVE

## 2021-11-19 NOTE — Progress Notes (Addendum)
PCP - Stevie Kern, MD Cardiologist - denies  PPM/ICD - denies Device Orders - n/a Rep Notified - n/a  Chest x-ray - 11/25/2019 EKG - 08/14/2021 Stress Test - denies ECHO - 2021 - [facility] Cardiac Cath - denies  Sleep Study - yes  CPAP - unable to tolerate  Fasting Blood Sugar - n/a  Blood Thinner Instructions: Plavix - last dose - on 11/16/2021 per patient  Aspirin Instructions: Patient was instructed: As of today, STOP taking any Aspirin (unless otherwise instructed by your surgeon) Aleve, Naproxen, Ibuprofen, Motrin, Advil, Goody's, BC's, all herbal medications, fish oil, and all vitamins.  ERAS Protcol - n/a  COVID TEST- done in PAT on 11/19/2021   Anesthesia review: yes - Hx of Stroke, COPD, CAD - asked PA for chart review (patient having surgery tomorrow)  Patient denies shortness of breath, fever, cough and chest pain at PAT appointment   All instructions explained to the patient, with a verbal understanding of the material. Patient agrees to go over the instructions while at home for a better understanding. Patient also instructed to self quarantine after being tested for COVID-19. The opportunity to ask questions was provided.

## 2021-11-19 NOTE — Anesthesia Preprocedure Evaluation (Addendum)
Anesthesia Evaluation  Patient identified by MRN, date of birth, ID band Patient awake    Reviewed: Allergy & Precautions, NPO status , Patient's Chart, lab work & pertinent test results  History of Anesthesia Complications Negative for: history of anesthetic complications  Airway Mallampati: IV  TM Distance: >3 FB Neck ROM: Full    Dental  (+) Upper Dentures, Lower Dentures   Pulmonary sleep apnea , COPD,  COPD inhaler, former smoker,    breath sounds clear to auscultation       Cardiovascular hypertension, (-) angina+ CAD  (-) CHF  Rhythm:Regular     Neuro/Psych PSYCHIATRIC DISORDERS Anxiety Depression CVA, No Residual Symptoms    GI/Hepatic Neg liver ROS, GERD  ,  Endo/Other  negative endocrine ROS  Renal/GU negative Renal ROS     Musculoskeletal negative musculoskeletal ROS (+)   Abdominal   Peds  Hematology negative hematology ROS (+)   Anesthesia Other Findings   Reproductive/Obstetrics                            Anesthesia Physical Anesthesia Plan  ASA: 3  Anesthesia Plan: General   Post-op Pain Management: Ofirmev IV (intra-op)   Induction: Intravenous  PONV Risk Score and Plan: 2 and Ondansetron, Dexamethasone and Propofol infusion  Airway Management Planned: Oral ETT  Additional Equipment: None  Intra-op Plan:   Post-operative Plan: Extubation in OR  Informed Consent: I have reviewed the patients History and Physical, chart, labs and discussed the procedure including the risks, benefits and alternatives for the proposed anesthesia with the patient or authorized representative who has indicated his/her understanding and acceptance.     Dental advisory given  Plan Discussed with: CRNA and Anesthesiologist  Anesthesia Plan Comments: (PAT note by Antionette Poles, PA-C: History of OSA intolerant to CPAP, CVA maintained on Plavix, OPD, post-COVID ILD, HTN.  Patient had a  severe bout of COVID-pneumonia January 2021 requiring admission.  At that time he was discharged on 3 L supplemental oxygen.  He has subsequent readmission with severe respiratory failure requiring up to 15 L of oxygen.  He had prolonged convalescence.  PFT show severe obstructive and restriction with severe impairment of gas exchange.  Last seen by pulmonology 05/23/2021 was reportedly doing much better at that time, had weaned from supplemental oxygen during the day, continue to use 2 to 3 L at night for nocturnal hypoxemia.  He is maintained on Symbicort daily and albuterol as needed.  Patient reports last dose of Plavix 11/16/2021.  Preop labs reviewed, unremarkable.  EKG 08/14/2021: Normal sinus rhythm.  Rate 69. Left anterior fasicular block. Minimal voltage criteria for LVH, may be normal variant ( R in aVL )  CT Chest 03/19/20 (care everywhere): IMPRESSION:  1. Marked improvement in interstitial thickening throughout the  lungs. There is significant residual interstitial thickening and  subpleural reticulation with a lower lobeand peripheral  predominance..  2. No specific nodularity. No airspace disease. Airways normal.   TTE 12/26/19 (care everywhere): SUMMARY  Image Quality: Technically difficult.  The left ventricular size is normal.  Left ventricular systolic function is normal.  LV ejection fraction = 55-60%.  There is trace mitral regurgitation.  There is trace tricuspid regurgitation.  There is no comparison study available.    )       Anesthesia Quick Evaluation

## 2021-11-19 NOTE — Progress Notes (Signed)
Anesthesia Chart Review:  History of OSA intolerant to CPAP, CVA maintained on Plavix, OPD, post-COVID ILD, HTN.  Patient had a severe bout of COVID-pneumonia January 2021 requiring admission.  At that time he was discharged on 3 L supplemental oxygen.  He has subsequent readmission with severe respiratory failure requiring up to 15 L of oxygen.  He had prolonged convalescence.  PFT show severe obstructive and restriction with severe impairment of gas exchange.  Last seen by pulmonology 05/23/2021 was reportedly doing much better at that time, had weaned from supplemental oxygen during the day, continue to use 2 to 3 L at night for nocturnal hypoxemia.  He is maintained on Symbicort daily and albuterol as needed.  Patient reports last dose of Plavix 11/16/2021.  Preop labs reviewed, unremarkable.  EKG 08/14/2021: Normal sinus rhythm.  Rate 69. Left anterior fasicular block. Minimal voltage criteria for LVH, may be normal variant ( R in aVL )  CT Chest 03/19/20 (care everywhere): IMPRESSION:  1. Marked improvement in interstitial thickening throughout the  lungs. There is significant residual interstitial thickening and  subpleural reticulation with a lower lobe and peripheral  predominance..  2. No specific nodularity.  No airspace disease.  Airways normal.    TTE 12/26/19 (care everywhere): SUMMARY  Image Quality: Technically difficult.  The left ventricular size is normal.  Left ventricular systolic function is normal.  LV ejection fraction = 55-60%.  There is trace mitral regurgitation.  There is trace tricuspid regurgitation.  There is no comparison study available.      Zannie Cove [facility] Short Stay Center/Anesthesiology Phone [phone] 11/19/2021 3:42 PM

## 2021-11-20 ENCOUNTER — Encounter (HOSPITAL_COMMUNITY)
Admission: RE | Disposition: A | Discharge status: Home / Self Care | Payer: Self-pay | Source: Home / Self Care | Attending: Otolaryngology

## 2021-11-20 ENCOUNTER — Ambulatory Visit (HOSPITAL_COMMUNITY): Payer: PPO

## 2021-11-20 ENCOUNTER — Ambulatory Visit (HOSPITAL_COMMUNITY): Payer: PPO | Admitting: Physician Assistant

## 2021-11-20 ENCOUNTER — Encounter (HOSPITAL_COMMUNITY): Payer: Self-pay | Admitting: Otolaryngology

## 2021-11-20 ENCOUNTER — Ambulatory Visit (HOSPITAL_COMMUNITY): Payer: PPO | Admitting: Anesthesiology

## 2021-11-20 ENCOUNTER — Other Ambulatory Visit: Payer: Self-pay

## 2021-11-20 ENCOUNTER — Ambulatory Visit (HOSPITAL_COMMUNITY)
Admission: RE | Admit: 2021-11-20 | Discharge: 2021-11-20 | Disposition: A | Discharge status: Home / Self Care | Payer: PPO | Attending: Otolaryngology | Admitting: Otolaryngology

## 2021-11-20 DIAGNOSIS — U099 Post covid-19 condition, unspecified: Secondary | ICD-10-CM | POA: Insufficient documentation

## 2021-11-20 DIAGNOSIS — J849 Interstitial pulmonary disease, unspecified: Secondary | ICD-10-CM | POA: Insufficient documentation

## 2021-11-20 DIAGNOSIS — G4733 Obstructive sleep apnea (adult) (pediatric): Secondary | ICD-10-CM | POA: Diagnosis not present

## 2021-11-20 DIAGNOSIS — Z6829 Body mass index (BMI) 29.0-29.9, adult: Secondary | ICD-10-CM | POA: Insufficient documentation

## 2021-11-20 DIAGNOSIS — Z7951 Long term (current) use of inhaled steroids: Secondary | ICD-10-CM | POA: Diagnosis not present

## 2021-11-20 DIAGNOSIS — Z79899 Other long term (current) drug therapy: Secondary | ICD-10-CM | POA: Insufficient documentation

## 2021-11-20 DIAGNOSIS — Z8673 Personal history of transient ischemic attack (TIA), and cerebral infarction without residual deficits: Secondary | ICD-10-CM | POA: Insufficient documentation

## 2021-11-20 DIAGNOSIS — I2510 Atherosclerotic heart disease of native coronary artery without angina pectoris: Secondary | ICD-10-CM | POA: Diagnosis not present

## 2021-11-20 DIAGNOSIS — K219 Gastro-esophageal reflux disease without esophagitis: Secondary | ICD-10-CM | POA: Diagnosis not present

## 2021-11-20 DIAGNOSIS — E663 Overweight: Secondary | ICD-10-CM | POA: Diagnosis not present

## 2021-11-20 DIAGNOSIS — Z87891 Personal history of nicotine dependence: Secondary | ICD-10-CM | POA: Diagnosis not present

## 2021-11-20 DIAGNOSIS — F419 Anxiety disorder, unspecified: Secondary | ICD-10-CM | POA: Insufficient documentation

## 2021-11-20 DIAGNOSIS — J449 Chronic obstructive pulmonary disease, unspecified: Secondary | ICD-10-CM | POA: Insufficient documentation

## 2021-11-20 DIAGNOSIS — I119 Hypertensive heart disease without heart failure: Secondary | ICD-10-CM | POA: Diagnosis not present

## 2021-11-20 DIAGNOSIS — F32A Depression, unspecified: Secondary | ICD-10-CM | POA: Insufficient documentation

## 2021-11-20 DIAGNOSIS — Z9981 Dependence on supplemental oxygen: Secondary | ICD-10-CM | POA: Diagnosis not present

## 2021-11-20 HISTORY — PX: IMPLANTATION OF HYPOGLOSSAL NERVE STIMULATOR: SHX6827

## 2021-11-20 SURGERY — INSERTION, HYPOGLOSSAL NERVE STIMULATOR
Anesthesia: General | Site: Chest

## 2021-11-20 MED ORDER — PROPOFOL 10 MG/ML IV BOLUS
INTRAVENOUS | Status: DC | PRN
Start: 2021-11-20 — End: 2021-11-20
  Administered 2021-11-20: 20 mg via INTRAVENOUS
  Administered 2021-11-20: 140 mg via INTRAVENOUS
  Administered 2021-11-20: 50 mg via INTRAVENOUS

## 2021-11-20 MED ORDER — FENTANYL CITRATE (PF) 250 MCG/5ML IJ SOLN
INTRAMUSCULAR | Status: DC | PRN
  Administered 2021-11-20: 100 ug via INTRAVENOUS
  Administered 2021-11-20 (×3): 50 ug via INTRAVENOUS

## 2021-11-20 MED ORDER — CEFAZOLIN SODIUM-DEXTROSE 2-3 GM-%(50ML) IV SOLR
INTRAVENOUS | Status: DC | PRN
  Administered 2021-11-20: 2 g via INTRAVENOUS

## 2021-11-20 MED ORDER — HYDROCODONE-ACETAMINOPHEN 5-325 MG PO TABS
ORAL_TABLET | ORAL | Status: AC
  Filled 2021-11-20: qty 1

## 2021-11-20 MED ORDER — CHLORHEXIDINE GLUCONATE CLOTH 2 % EX PADS: 6.0000 | MEDICATED_PAD | Freq: Once | CUTANEOUS | Status: DC

## 2021-11-20 MED ORDER — MIDAZOLAM HCL 5 MG/5ML IJ SOLN
INTRAMUSCULAR | Status: DC | PRN
  Administered 2021-11-20: 2 mg via INTRAVENOUS

## 2021-11-20 MED ORDER — CEFAZOLIN SODIUM-DEXTROSE 2-4 GM/100ML-% IV SOLN
INTRAVENOUS | Status: AC
  Filled 2021-11-20: qty 100

## 2021-11-20 MED ORDER — EPHEDRINE 5 MG/ML INJ
INTRAVENOUS | Status: AC
  Filled 2021-11-20: qty 5

## 2021-11-20 MED ORDER — PROPOFOL 1000 MG/100ML IV EMUL
INTRAVENOUS | Status: AC
  Filled 2021-11-20: qty 100

## 2021-11-20 MED ORDER — LIDOCAINE 2% (20 MG/ML) 5 ML SYRINGE
INTRAMUSCULAR | Status: AC
  Filled 2021-11-20: qty 5

## 2021-11-20 MED ORDER — LACTATED RINGERS IV SOLN: INTRAVENOUS | Status: DC

## 2021-11-20 MED ORDER — FENTANYL CITRATE (PF) 100 MCG/2ML IJ SOLN
INTRAMUSCULAR | Status: AC
  Filled 2021-11-20: qty 2

## 2021-11-20 MED ORDER — SUCCINYLCHOLINE CHLORIDE 200 MG/10ML IV SOSY
PREFILLED_SYRINGE | INTRAVENOUS | Status: DC | PRN
Start: 2021-11-20 — End: 2021-11-20
  Administered 2021-11-20: 160 mg via INTRAVENOUS

## 2021-11-20 MED ORDER — HYDROMORPHONE HCL 1 MG/ML IJ SOLN
INTRAMUSCULAR | Status: AC
  Filled 2021-11-20: qty 0.5

## 2021-11-20 MED ORDER — ORAL CARE MOUTH RINSE: 15.0000 mL | Freq: Once | OROMUCOSAL | Status: AC

## 2021-11-20 MED ORDER — DEXAMETHASONE SODIUM PHOSPHATE 10 MG/ML IJ SOLN
INTRAMUSCULAR | Status: DC | PRN
  Administered 2021-11-20: 10 mg via INTRAVENOUS

## 2021-11-20 MED ORDER — CEFAZOLIN SODIUM-DEXTROSE 2-4 GM/100ML-% IV SOLN: 2.0000 g | INTRAVENOUS | Status: DC

## 2021-11-20 MED ORDER — ORAL CARE MOUTH RINSE: 15.0000 mL | Freq: Once | OROMUCOSAL | Status: DC

## 2021-11-20 MED ORDER — FENTANYL CITRATE (PF) 250 MCG/5ML IJ SOLN
INTRAMUSCULAR | Status: AC
  Filled 2021-11-20: qty 5

## 2021-11-20 MED ORDER — ONDANSETRON HCL 4 MG/2ML IJ SOLN
INTRAMUSCULAR | Status: DC | PRN
Start: 2021-11-20 — End: 2021-11-20
  Administered 2021-11-20: 4 mg via INTRAVENOUS

## 2021-11-20 MED ORDER — CHLORHEXIDINE GLUCONATE 0.12 % MT SOLN
OROMUCOSAL | Status: AC
  Administered 2021-11-20: 15 mL via OROMUCOSAL
  Filled 2021-11-20: qty 15

## 2021-11-20 MED ORDER — HYDROCODONE-ACETAMINOPHEN 5-325 MG PO TABS
1.0000 | ORAL_TABLET | Freq: Four times a day (QID) | ORAL | 0 refills | Status: AC | PRN
Start: 2021-11-20 — End: 2022-11-20

## 2021-11-20 MED ORDER — ONDANSETRON HCL 4 MG/2ML IJ SOLN
INTRAMUSCULAR | Status: AC
  Filled 2021-11-20: qty 2

## 2021-11-20 MED ORDER — CHLORHEXIDINE GLUCONATE 0.12 % MT SOLN: 15.0000 mL | Freq: Once | OROMUCOSAL | Status: DC

## 2021-11-20 MED ORDER — PROPOFOL 10 MG/ML IV BOLUS
INTRAVENOUS | Status: AC
  Filled 2021-11-20: qty 20

## 2021-11-20 MED ORDER — LIDOCAINE-EPINEPHRINE 1 %-1:100000 IJ SOLN
INTRAMUSCULAR | Status: AC
  Filled 2021-11-20: qty 1

## 2021-11-20 MED ORDER — LIDOCAINE-EPINEPHRINE 1 %-1:100000 IJ SOLN
INTRAMUSCULAR | Status: DC | PRN
  Administered 2021-11-20: 5 mL

## 2021-11-20 MED ORDER — MIDAZOLAM HCL 2 MG/2ML IJ SOLN
INTRAMUSCULAR | Status: AC
  Filled 2021-11-20: qty 2

## 2021-11-20 MED ORDER — FENTANYL CITRATE (PF) 100 MCG/2ML IJ SOLN
25.0000 ug | INTRAMUSCULAR | Status: DC | PRN
  Administered 2021-11-20 (×2): 50 ug via INTRAVENOUS

## 2021-11-20 MED ORDER — 0.9 % SODIUM CHLORIDE (POUR BTL) OPTIME
TOPICAL | Status: DC | PRN
  Administered 2021-11-20: 200 mL

## 2021-11-20 MED ORDER — HYDROCODONE-ACETAMINOPHEN 5-325 MG PO TABS
1.0000 | ORAL_TABLET | Freq: Once | ORAL | Status: AC
  Administered 2021-11-20: 1 via ORAL

## 2021-11-20 MED ORDER — PHENYLEPHRINE HCL-NACL 20-0.9 MG/250ML-% IV SOLN
INTRAVENOUS | Status: DC | PRN
  Administered 2021-11-20: 25 ug/min via INTRAVENOUS

## 2021-11-20 MED ORDER — LIDOCAINE 2% (20 MG/ML) 5 ML SYRINGE
INTRAMUSCULAR | Status: DC | PRN
  Administered 2021-11-20: 60 mg via INTRAVENOUS

## 2021-11-20 MED ORDER — CHLORHEXIDINE GLUCONATE 0.12 % MT SOLN: 15.0000 mL | Freq: Once | OROMUCOSAL | Status: AC

## 2021-11-20 MED ORDER — PROPOFOL 500 MG/50ML IV EMUL
INTRAVENOUS | Status: DC | PRN
  Administered 2021-11-20: 50 ug/kg/min via INTRAVENOUS

## 2021-11-20 MED ORDER — DEXAMETHASONE SODIUM PHOSPHATE 10 MG/ML IJ SOLN
INTRAMUSCULAR | Status: AC
  Filled 2021-11-20: qty 1

## 2021-11-20 SURGICAL SUPPLY — 68 items
ACC NRSTM 4 TRQ WRNCH STRL (MISCELLANEOUS)
ADH SKN CLS APL DERMABOND .7 (GAUZE/BANDAGES/DRESSINGS) ×2
BAG COUNTER SPONGE SURGICOUNT (BAG) ×3 IMPLANT
BAG SPNG CNTER NS LX DISP (BAG) ×1
BLADE CLIPPER SURG (BLADE) IMPLANT
BLADE SURG 15 STRL LF DISP TIS (BLADE) ×6 IMPLANT
BLADE SURG 15 STRL SS (BLADE) ×2
CANISTER SUCT 3000ML PPV (MISCELLANEOUS) ×3 IMPLANT
CORD BIPOLAR FORCEPS 12FT (ELECTRODE) ×3 IMPLANT
COVER PROBE W GEL 5X96 (DRAPES) ×3 IMPLANT
COVER SURGICAL LIGHT HANDLE (MISCELLANEOUS) ×3 IMPLANT
DERMABOND ADVANCED (GAUZE/BANDAGES/DRESSINGS) ×2
DERMABOND ADVANCED .7 DNX12 (GAUZE/BANDAGES/DRESSINGS) ×4 IMPLANT
DRAPE C-ARM 35X43 STRL (DRAPES) ×3 IMPLANT
DRAPE HEAD BAR (DRAPES) ×3 IMPLANT
DRAPE INCISE IOBAN 66X45 STRL (DRAPES) ×3 IMPLANT
DRAPE MICROSCOPE LEICA 54X105 (DRAPES) ×3 IMPLANT
DRAPE UTILITY XL STRL (DRAPES) ×1 IMPLANT
DRSG TEGADERM 4X4.75 (GAUZE/BANDAGES/DRESSINGS) ×9 IMPLANT
ELECT COATED BLADE 2.86 ST (ELECTRODE) ×3 IMPLANT
ELECT EMG 18 NIMS (NEUROSURGERY SUPPLIES) ×2
ELECT REM PT RETURN 9FT ADLT (ELECTROSURGICAL) ×2
ELECTRODE EMG 18 NIMS (NEUROSURGERY SUPPLIES) ×2 IMPLANT
ELECTRODE REM PT RTRN 9FT ADLT (ELECTROSURGICAL) ×2 IMPLANT
FORCEPS BIPOLAR SPETZLER 8 1.0 (NEUROSURGERY SUPPLIES) ×3 IMPLANT
GAUZE 4X4 16PLY ~~LOC~~+RFID DBL (SPONGE) ×4 IMPLANT
GAUZE SPONGE 4X4 12PLY STRL (GAUZE/BANDAGES/DRESSINGS) ×3 IMPLANT
GENERATOR PULSE INSPIRE (Generator) ×2 IMPLANT
GENERATOR PULSE INSPIRE IV (Generator) ×2 IMPLANT
GLOVE SURG ENC MOIS LTX SZ6.5 (GLOVE) ×1 IMPLANT
GLOVE SURG ENC MOIS LTX SZ7.5 (GLOVE) ×4 IMPLANT
GOWN STRL REUS W/ TWL LRG LVL3 (GOWN DISPOSABLE) ×6 IMPLANT
GOWN STRL REUS W/TWL LRG LVL3 (GOWN DISPOSABLE) ×6
KIT BASIN OR (CUSTOM PROCEDURE TRAY) ×3 IMPLANT
KIT NEURO ACCESSORY W/WRENCH (MISCELLANEOUS) IMPLANT
KIT TURNOVER KIT B (KITS) ×3 IMPLANT
LEAD SENSING RESP INSPIRE (Lead) ×2 IMPLANT
LEAD SENSING RESP INSPIRE IV (Lead) ×2 IMPLANT
LEAD SLEEP STIM INSPIRE IV/V (Lead) ×2 IMPLANT
LEAD SLEEP STIMULATION INSPIRE (Lead) ×2 IMPLANT
LOOP VESSEL MAXI BLUE (MISCELLANEOUS) ×3 IMPLANT
LOOP VESSEL MINI RED (MISCELLANEOUS) ×3 IMPLANT
MARKER SKIN DUAL TIP RULER LAB (MISCELLANEOUS) ×6 IMPLANT
NDL HYPO 25GX1X1/2 BEV (NEEDLE) ×2 IMPLANT
NEEDLE HYPO 25GX1X1/2 BEV (NEEDLE) ×2 IMPLANT
NS IRRIG 1000ML POUR BTL (IV SOLUTION) ×3 IMPLANT
PAD ARMBOARD 7.5X6 YLW CONV (MISCELLANEOUS) ×4 IMPLANT
PASSER CATH 38CM DISP (INSTRUMENTS) ×3 IMPLANT
PENCIL SMOKE EVACUATOR (MISCELLANEOUS) ×3 IMPLANT
POSITIONER HEAD DONUT 9IN (MISCELLANEOUS) ×3 IMPLANT
PROBE NERVE STIMULATOR (NEUROSURGERY SUPPLIES) ×3 IMPLANT
REMOTE CONTROL SLEEP INSPIRE (MISCELLANEOUS) ×3 IMPLANT
SET WALTER ACTIVATION W/DRAPE (SET/KITS/TRAYS/PACK) ×3 IMPLANT
SPONGE INTESTINAL PEANUT (DISPOSABLE) ×4 IMPLANT
STAPLER VISISTAT 35W (STAPLE) ×3 IMPLANT
SUT SILK 2 0 SH (SUTURE) ×3 IMPLANT
SUT SILK 3 0 REEL (SUTURE) ×3 IMPLANT
SUT SILK 3 0 SH 30 (SUTURE) ×6 IMPLANT
SUT SILK 3-0 (SUTURE) ×2
SUT SILK 3-0 RB1 30XBRD (SUTURE) ×1
SUT VIC AB 3-0 SH 27 (SUTURE) ×4
SUT VIC AB 3-0 SH 27X BRD (SUTURE) ×4 IMPLANT
SUT VIC AB 4-0 PS2 27 (SUTURE) ×6 IMPLANT
SUTURE SILK 3-0 RB1 30XBRD (SUTURE) ×2 IMPLANT
SYR 10ML LL (SYRINGE) ×3 IMPLANT
TAPE CLOTH SURG 4X10 WHT LF (GAUZE/BANDAGES/DRESSINGS) ×3 IMPLANT
TOWEL GREEN STERILE (TOWEL DISPOSABLE) ×3 IMPLANT
TRAY ENT MC OR (CUSTOM PROCEDURE TRAY) ×3 IMPLANT

## 2021-11-20 NOTE — Op Note (Signed)

## 2021-11-20 NOTE — H&P (Signed)
Matthew Sharp is an 71 y.o. male.   Chief Complaint: Sleep apnea HPI: 71 year old male with obstructive sleep apnea who has not been able to tolerate CPAP.  Past Medical History:  Diagnosis Date   Anxiety    BPH (benign prostatic hyperplasia)    Chronic back pain    COPD (chronic obstructive pulmonary disease) (HCC)    Coronary artery disease    Depression    GERD (gastroesophageal reflux disease)    History of kidney stones    Hypertension    Interstitial lung disease (HCC)    secondary to covid   Pneumonia    with COVID   Sleep apnea    does not use CPAP   Stroke (HCC)    on Plavix, no deficits    Past Surgical History:  Procedure Laterality Date   COLONOSCOPY     DRUG INDUCED ENDOSCOPY N/A 08/14/2021   Procedure: DRUG INDUCED SLEEP ENDOSCOPY;  Surgeon: Christia ReadingBates, Koby Pickup, MD;  Location: [facility];  Service: ENT;  Laterality: N/A;    History reviewed. No pertinent family history. Social History:  reports that he has quit smoking. His smoking use included cigarettes. He has never used smokeless tobacco. He reports that he does not drink alcohol and does not use drugs.  Allergies:  Allergies  Allergen Reactions   Ciprofloxacin Nausea Only   Statins     Myalgias    Medications Prior to Admission  Medication Sig Dispense Refill   acetaminophen (TYLENOL) 500 MG tablet Take 1,000 mg by mouth every 6 (six) hours as needed.     albuterol (VENTOLIN HFA) 108 (90 Base) MCG/ACT inhaler Inhale into the lungs every 6 (six) hours as needed for wheezing or shortness of breath.     ALPRAZolam (XANAX) 0.5 MG tablet Take 0.5 mg by mouth at bedtime as needed for anxiety.     Ascorbic Acid (VITAMIN C) 1000 MG tablet Take 1,000 mg by mouth daily.     budesonide-formoterol (SYMBICORT) 160-4.5 MCG/ACT inhaler Inhale 2 puffs into the lungs 2 (two) times daily.     cholecalciferol (VITAMIN D3) 25 MCG (1000 UNIT) tablet Take 1,000 Units by mouth daily.     clopidogrel (PLAVIX) 75 MG  tablet Take 75 mg by mouth daily.     furosemide (LASIX) 40 MG tablet Take 40 mg by mouth every other day.     Misc Natural Products (GLUCOSAMINE CHOND COMPLEX/MSM PO) Take 1 tablet by mouth daily.     potassium chloride (MICRO-K) 10 MEQ CR capsule Take 10 mEq by mouth daily.     sertraline (ZOLOFT) 100 MG tablet Take 100 mg by mouth daily.     tadalafil (CIALIS) 5 MG tablet Take 5 mg by mouth at bedtime.     tamsulosin (FLOMAX) 0.4 MG CAPS capsule Take 0.4 mg by mouth at bedtime.     traMADol (ULTRAM) 50 MG tablet Take 50-100 mg by mouth every 6 (six) hours as needed for severe pain.     vitamin B-12 (CYANOCOBALAMIN) 1000 MCG tablet Take 1,000 mcg by mouth daily.      Results for orders placed or performed during the hospital encounter of 11/19/21 (from the past 48 hour(s))  SARS CORONAVIRUS 2 (TAT 6-24 HRS) Nasopharyngeal Nasopharyngeal Swab     Status: None   Collection Time: 11/19/21  2:12 PM   Specimen: Nasopharyngeal Swab  Result Value Ref Range   SARS Coronavirus 2 NEGATIVE NEGATIVE    Comment: (NOTE) SARS-CoV-2 target nucleic acids  are NOT DETECTED.  The SARS-CoV-2 RNA is generally detectable in upper and lower respiratory specimens during the acute phase of infection. Negative results do not preclude SARS-CoV-2 infection, do not rule out co-infections with other pathogens, and should not be used as the sole basis for treatment or other patient management decisions. Negative results must be combined with clinical observations, patient history, and epidemiological information. The expected result is Negative.  Fact Sheet for Patients: HairSlick.no  Fact Sheet for Healthcare Providers: quierodirigir.com  This test is not yet approved or cleared by the [place] FDA and  has been authorized for detection and/or diagnosis of SARS-CoV-2 by FDA under an Emergency Use Authorization (EUA). This EUA will remain  in effect  (meaning this test can be used) for the duration of the COVID-19 declaration under Se ction 564(b)(1) of the Act, 21 U.S.C. section 360bbb-3(b)(1), unless the authorization is terminated or revoked sooner.  Performed at [facility], [address] [place], [place] [address]   Basic metabolic panel per protocol     Status: None   Collection Time: 11/19/21  2:35 PM  Result Value Ref Range   Sodium 137 135 - 145 mmol/L   Potassium 3.5 3.5 - 5.1 mmol/L   Chloride 100 98 - 111 mmol/L   CO2 27 22 - 32 mmol/L   Glucose, Bld 89 70 - 99 mg/dL    Comment: Glucose reference range applies only to samples taken after fasting for at least 8 hours.   BUN 13 8 - 23 mg/dL   Creatinine, Ser 6.44 0.61 - 1.24 mg/dL   Calcium 9.9 8.9 - 03.4 mg/dL   GFR, Estimated >74 >25 mL/min    Comment: (NOTE) Calculated using the CKD-EPI Creatinine Equation (2021)    Anion gap 10 5 - 15    Comment: Performed at [facility], [address], [place], [place] [address]  CBC per protocol     Status: Abnormal   Collection Time: 11/19/21  2:35 PM  Result Value Ref Range   WBC 11.2 (H) 4.0 - 10.5 K/uL   RBC 5.43 4.22 - 5.81 MIL/uL   Hemoglobin 16.4 13.0 - 17.0 g/dL   HCT 75.6 43.3 - 29.5 %   MCV 89.0 80.0 - 100.0 fL   MCH 30.2 26.0 - 34.0 pg   MCHC 34.0 30.0 - 36.0 g/dL   RDW 18.8 41.6 - 60.6 %   Platelets 327 150 - 400 K/uL   nRBC 0.0 0.0 - 0.2 %    Comment: Performed at [facility], [address] [place], [place] [address]   No results found.  Review of Systems  All other systems reviewed and are negative.  Blood pressure (!) 150/83, pulse 62, temperature 97.6 F (36.4 C), temperature source Oral, resp. rate 18, height 5\' 10"  (1.778 m), weight 93.9 kg, SpO2 95 %. Physical Exam Constitutional:      Appearance: Normal appearance. He is normal weight.  HENT:     Head: Normocephalic and atraumatic.     Right Ear: External ear normal.     Left Ear: External ear normal.     Nose:  Nose normal.     Mouth/Throat:     Mouth: Mucous membranes are moist.     Pharynx: Oropharynx is clear.  Eyes:     Extraocular Movements: Extraocular movements intact.     Conjunctiva/sclera: Conjunctivae normal.     Pupils: Pupils are equal, round, and reactive to light.  Cardiovascular:  Rate and Rhythm: Normal rate.  Pulmonary:     Effort: Pulmonary effort is normal.  Musculoskeletal:     Cervical back: Normal range of motion.  Skin:    General: Skin is warm and dry.  Neurological:     General: No focal deficit present.     Mental Status: He is alert and oriented to person, place, and time.  Psychiatric:        Mood and Affect: Mood normal.        Behavior: Behavior normal.        Thought Content: Thought content normal.        Judgment: Judgment normal.     Assessment/Plan Obstructive sleep apnea and BMI 29.70.  To OR for hypoglossal nerve stimulator placement.  Christia Reading, MD 11/20/2021, 8:25 AM

## 2021-11-20 NOTE — Progress Notes (Signed)
Radiology called to report the imaging report. I called to make Dr. Redmond Baseman aware and he was ok with the images.

## 2021-11-20 NOTE — Transfer of Care (Signed)
Immediate Anesthesia Transfer of Care Note  Patient: Matthew Sharp  Procedure(s) Performed: IMPLANTATION OF HYPOGLOSSAL NERVE STIMULATOR (Chest)  Patient Location: PACU  Anesthesia Type:General  Level of Consciousness: drowsy and patient cooperative  Airway & Oxygen Therapy: Patient Spontanous Breathing and Patient connected to face mask oxygen  Post-op Assessment: Report given to RN and Post -op Vital signs reviewed and stable  Post vital signs: Reviewed and stable  Last Vitals:  Vitals Value Taken Time  BP 174/94 11/20/21 1237  Temp    Pulse 69 11/20/21 1240  Resp 13 11/20/21 1240  SpO2 94 % 11/20/21 1240  Vitals shown include unvalidated device data.  Last Pain:  Vitals:   11/20/21 0829  TempSrc:   PainSc: 0-No pain         Complications: No notable events documented.

## 2021-11-20 NOTE — Anesthesia Procedure Notes (Signed)
Procedure Name: Intubation Date/Time: 11/20/2021 10:20 AM Performed by: Adria Dill, CRNA Pre-anesthesia Checklist: Patient identified, Emergency Drugs available, Suction available and Patient being monitored Patient Re-evaluated:Patient Re-evaluated prior to induction Oxygen Delivery Method: Circle system utilized Preoxygenation: Pre-oxygenation with 100% oxygen Induction Type: IV induction Ventilation: Mask ventilation without difficulty Laryngoscope Size: Miller and 3 Grade View: Grade I Tube type: Oral Tube size: 7.5 mm Number of attempts: 1 Airway Equipment and Method: Stylet and Oral airway Placement Confirmation: ETT inserted through vocal cords under direct vision, positive ETCO2 and breath sounds checked- equal and bilateral Secured at: 22 cm Tube secured with: Tape Dental Injury: Teeth and Oropharynx as per pre-operative assessment

## 2021-11-20 NOTE — Brief Op Note (Signed)
11/20/2021  12:28 PM  PATIENT:  Matthew Sharp  71 y.o. male  PRE-OPERATIVE DIAGNOSIS:  SLEEP APNEA  POST-OPERATIVE DIAGNOSIS:  SLEEP APNEA  PROCEDURE:  Procedure(s): IMPLANTATION OF HYPOGLOSSAL NERVE STIMULATOR (N/A)  SURGEON:  Surgeon(s) and Role:    Melida Quitter, MD - Primary  PHYSICIAN ASSISTANT: [place]  ASSISTANTS: none   ANESTHESIA:   general  EBL:  50 mL   BLOOD ADMINISTERED:none  DRAINS: none   LOCAL MEDICATIONS USED:  LIDOCAINE   SPECIMEN:  No Specimen  DISPOSITION OF SPECIMEN:  N/A  COUNTS:  YES  TOURNIQUET:  * No tourniquets in log *  DICTATION: .Note written in EPIC  PLAN OF CARE: Discharge to home after PACU  PATIENT DISPOSITION:  PACU - hemodynamically stable.   Delay start of Pharmacological VTE agent (>24hrs) due to surgical blood loss or risk of bleeding: no

## 2021-11-21 ENCOUNTER — Encounter (HOSPITAL_COMMUNITY): Payer: Self-pay | Admitting: Otolaryngology

## 2021-11-21 NOTE — Anesthesia Postprocedure Evaluation (Signed)
Anesthesia Post Note  Patient: Matthew Sharp  Procedure(s) Performed: IMPLANTATION OF HYPOGLOSSAL NERVE STIMULATOR (Chest)     Patient location during evaluation: PACU Anesthesia Type: General Level of consciousness: awake and alert Pain management: pain level controlled Vital Signs Assessment: post-procedure vital signs reviewed and stable Respiratory status: spontaneous breathing, nonlabored ventilation, respiratory function stable and patient connected to nasal cannula oxygen Cardiovascular status: blood pressure returned to baseline and stable Postop Assessment: no apparent nausea or vomiting Anesthetic complications: no   No notable events documented.  Last Vitals:  Vitals:   11/20/21 1325 11/20/21 1340  BP: (!) 158/91 (!) 146/93  Pulse: (!) 56 60  Resp: 16 11  Temp:  36.7 C  SpO2: 99% 99%    Last Pain:  Vitals:   11/20/21 1340  TempSrc:   PainSc: 3                  Jameal Razzano

## 2022-06-02 ENCOUNTER — Other Ambulatory Visit: Payer: Self-pay

## 2022-06-02 DIAGNOSIS — N400 Enlarged prostate without lower urinary tract symptoms: Secondary | ICD-10-CM | POA: Insufficient documentation

## 2022-06-02 DIAGNOSIS — J849 Interstitial pulmonary disease, unspecified: Secondary | ICD-10-CM | POA: Insufficient documentation

## 2022-06-02 DIAGNOSIS — I444 Left anterior fascicular block: Secondary | ICD-10-CM | POA: Insufficient documentation

## 2022-06-02 DIAGNOSIS — I2510 Atherosclerotic heart disease of native coronary artery without angina pectoris: Secondary | ICD-10-CM | POA: Insufficient documentation

## 2022-06-02 DIAGNOSIS — H9191 Unspecified hearing loss, right ear: Secondary | ICD-10-CM | POA: Insufficient documentation

## 2022-06-02 DIAGNOSIS — G4700 Insomnia, unspecified: Secondary | ICD-10-CM | POA: Insufficient documentation

## 2022-06-02 DIAGNOSIS — E6609 Other obesity due to excess calories: Secondary | ICD-10-CM | POA: Insufficient documentation

## 2022-06-02 DIAGNOSIS — M5136 Other intervertebral disc degeneration, lumbar region: Secondary | ICD-10-CM | POA: Insufficient documentation

## 2022-06-02 DIAGNOSIS — G4733 Obstructive sleep apnea (adult) (pediatric): Secondary | ICD-10-CM | POA: Insufficient documentation

## 2022-06-02 DIAGNOSIS — J189 Pneumonia, unspecified organism: Secondary | ICD-10-CM | POA: Insufficient documentation

## 2022-06-02 DIAGNOSIS — F330 Major depressive disorder, recurrent, mild: Secondary | ICD-10-CM | POA: Insufficient documentation

## 2022-06-02 DIAGNOSIS — K219 Gastro-esophageal reflux disease without esophagitis: Secondary | ICD-10-CM | POA: Insufficient documentation

## 2022-06-02 DIAGNOSIS — F419 Anxiety disorder, unspecified: Secondary | ICD-10-CM | POA: Insufficient documentation

## 2022-06-02 DIAGNOSIS — T466X5A Adverse effect of antihyperlipidemic and antiarteriosclerotic drugs, initial encounter: Secondary | ICD-10-CM | POA: Insufficient documentation

## 2022-06-02 DIAGNOSIS — I10 Essential (primary) hypertension: Secondary | ICD-10-CM | POA: Insufficient documentation

## 2022-06-02 DIAGNOSIS — M1990 Unspecified osteoarthritis, unspecified site: Secondary | ICD-10-CM | POA: Insufficient documentation

## 2022-06-02 DIAGNOSIS — G4730 Sleep apnea, unspecified: Secondary | ICD-10-CM | POA: Insufficient documentation

## 2022-06-02 DIAGNOSIS — I639 Cerebral infarction, unspecified: Secondary | ICD-10-CM | POA: Insufficient documentation

## 2022-06-02 DIAGNOSIS — M7662 Achilles tendinitis, left leg: Secondary | ICD-10-CM | POA: Insufficient documentation

## 2022-06-02 DIAGNOSIS — F32A Depression, unspecified: Secondary | ICD-10-CM | POA: Insufficient documentation

## 2022-06-02 DIAGNOSIS — G2581 Restless legs syndrome: Secondary | ICD-10-CM | POA: Insufficient documentation

## 2022-06-02 DIAGNOSIS — R0609 Other forms of dyspnea: Secondary | ICD-10-CM | POA: Insufficient documentation

## 2022-06-02 DIAGNOSIS — R7303 Prediabetes: Secondary | ICD-10-CM | POA: Insufficient documentation

## 2022-06-02 DIAGNOSIS — G8929 Other chronic pain: Secondary | ICD-10-CM | POA: Insufficient documentation

## 2022-06-02 DIAGNOSIS — M7752 Other enthesopathy of left foot: Secondary | ICD-10-CM | POA: Insufficient documentation

## 2022-06-02 DIAGNOSIS — E782 Mixed hyperlipidemia: Secondary | ICD-10-CM | POA: Insufficient documentation

## 2022-06-02 DIAGNOSIS — J432 Centrilobular emphysema: Secondary | ICD-10-CM | POA: Insufficient documentation

## 2022-06-02 DIAGNOSIS — M791 Myalgia, unspecified site: Secondary | ICD-10-CM | POA: Insufficient documentation

## 2022-06-02 DIAGNOSIS — I700 Atherosclerosis of aorta: Secondary | ICD-10-CM | POA: Insufficient documentation

## 2022-06-02 DIAGNOSIS — M7750 Other enthesopathy of unspecified foot: Secondary | ICD-10-CM | POA: Insufficient documentation

## 2022-06-02 DIAGNOSIS — Z8673 Personal history of transient ischemic attack (TIA), and cerebral infarction without residual deficits: Secondary | ICD-10-CM | POA: Insufficient documentation

## 2022-06-02 DIAGNOSIS — J449 Chronic obstructive pulmonary disease, unspecified: Secondary | ICD-10-CM | POA: Insufficient documentation

## 2022-06-02 DIAGNOSIS — Z87442 Personal history of urinary calculi: Secondary | ICD-10-CM | POA: Insufficient documentation

## 2022-06-02 DIAGNOSIS — K2270 Barrett's esophagus without dysplasia: Secondary | ICD-10-CM | POA: Insufficient documentation

## 2022-06-02 DIAGNOSIS — R454 Irritability and anger: Secondary | ICD-10-CM | POA: Insufficient documentation

## 2022-06-02 DIAGNOSIS — R972 Elevated prostate specific antigen [PSA]: Secondary | ICD-10-CM | POA: Insufficient documentation

## 2022-06-02 DIAGNOSIS — R5381 Other malaise: Secondary | ICD-10-CM | POA: Insufficient documentation

## 2022-06-02 DIAGNOSIS — E291 Testicular hypofunction: Secondary | ICD-10-CM | POA: Insufficient documentation

## 2022-06-02 DIAGNOSIS — Z8616 Personal history of COVID-19: Secondary | ICD-10-CM | POA: Insufficient documentation

## 2022-06-06 ENCOUNTER — Ambulatory Visit: Payer: PPO | Admitting: Cardiology

## 2022-06-06 ENCOUNTER — Encounter: Payer: Self-pay | Admitting: Cardiology

## 2022-06-06 VITALS — BP 117/76 | HR 88 | Ht 68.0 in | Wt 207.4 lb

## 2022-06-06 DIAGNOSIS — I10 Essential (primary) hypertension: Secondary | ICD-10-CM | POA: Diagnosis not present

## 2022-06-06 DIAGNOSIS — I700 Atherosclerosis of aorta: Secondary | ICD-10-CM

## 2022-06-06 DIAGNOSIS — E782 Mixed hyperlipidemia: Secondary | ICD-10-CM

## 2022-06-06 DIAGNOSIS — R0609 Other forms of dyspnea: Secondary | ICD-10-CM

## 2022-06-06 DIAGNOSIS — I2510 Atherosclerotic heart disease of native coronary artery without angina pectoris: Secondary | ICD-10-CM

## 2022-06-06 NOTE — Patient Instructions (Addendum)
Medication Instructions:  Your physician recommends that you continue on your current medications as directed. Please refer to the Current Medication list given to you today.  *If you need a refill on your cardiac medications before your next appointment, please call your pharmacy*   Lab Work: None ordered If you have labs (blood work) drawn today and your tests are completely normal, you will receive your results only by: MyChart Message (if you have MyChart) OR A paper copy in the mail If you have any lab test that is abnormal or we need to change your treatment, we will call you to review the results.   Testing/Procedures: Your physician has requested that you have a lexiscan myoview. For further information please visit [email or web address]. Please follow instruction sheet, as given.  The test will take approximately 3 to 4 hours to complete; you may bring reading material.  If someone comes with you to your appointment, they will need to remain in the main lobby due to limited space in the testing area. **If you are pregnant or breastfeeding, please notify the nuclear lab prior to your appointment**  How to prepare for your Myocardial Perfusion Test: Do not eat or drink 3 hours prior to your test, except you may have water. Do not consume products containing caffeine (regular or decaffeinated) 12 hours prior to your test. (ex: coffee, chocolate, sodas, tea). Do bring a list of your current medications with you.  If not listed below, you may take your medications as normal. Do wear comfortable clothes (no dresses or overalls) and walking shoes, tennis shoes preferred (No heels or open toe shoes are allowed). Do NOT wear cologne, perfume, aftershave, or lotions (deodorant is allowed). If these instructions are not followed, your test will have to be rescheduled.  Your physician has requested that you have an echocardiogram. Echocardiography is a painless test that uses sound waves to  create images of your heart. It provides your doctor with information about the size and shape of your heart and how well your heart's chambers and valves are working. This procedure takes approximately one hour. There are no restrictions for this procedure.   Follow-Up: At [facility], you and your health needs are our priority.  As part of our continuing mission to provide you with exceptional heart care, we have created designated Provider Care Teams.  These Care Teams include your primary Cardiologist (physician) and Advanced Practice Providers (APPs -  Physician Assistants and Nurse Practitioners) who all work together to provide you with the care you need, when you need it.  We recommend signing up for the patient portal called "MyChart".  Sign up information is provided on this After Visit Summary.  MyChart is used to connect with patients for Virtual Visits (Telemedicine).  Patients are able to view lab/test results, encounter notes, upcoming appointments, etc.  Non-urgent messages can be sent to your provider as well.   To learn more about what you can do with MyChart, go to [email or web address].    Your next appointment:   6 month(s)  The format for your next appointment:   In Person  Provider:   Rajan Revankar, MD   Other Instructions Cardiac Nuclear Scan A cardiac nuclear scan is a test that is done to check the flow of blood to your heart. It is done when you are resting and when you are exercising. The test looks for problems such as: Not enough blood reaching a portion of the heart. The heart muscle not working as   it should. You may need this test if: You have heart disease. You have had lab results that are not normal. You have had heart surgery or a balloon procedure to open up blocked arteries (angioplasty). You have chest pain. You have shortness of breath. In this test, a special dye (tracer) is put into your bloodstream. The tracer will travel to your heart. A  camera will then take pictures of your heart to see how the tracer moves through your heart. This test is usually done at a hospital and takes 2-4 hours. Tell a doctor about: Any allergies you have. All medicines you are taking, including vitamins, herbs, eye drops, creams, and over-the-counter medicines. Any problems you or family members have had with anesthetic medicines. Any blood disorders you have. Any surgeries you have had. Any medical conditions you have. Whether you are pregnant or may be pregnant. What are the risks? Generally, this is a safe test. However, problems may occur, such as: Serious chest pain and heart attack. This is only a risk if the stress portion of the test is done. Rapid heartbeat. A feeling of warmth in your chest. This feeling usually does not last long. Allergic reaction to the tracer. What happens before the test? Ask your doctor about changing or stopping your normal medicines. This is important. Follow instructions from your doctor about what you cannot eat or drink. Remove your jewelry on the day of the test. What happens during the test? An IV tube will be inserted into one of your veins. Your doctor will give you a small amount of tracer through the IV tube. You will wait for 20-40 minutes while the tracer moves through your bloodstream. Your heart will be monitored with an electrocardiogram (ECG). You will lie down on an exam table. Pictures of your heart will be taken for about 15-20 minutes. You may also have a stress test. For this test, one of these things may be done: You will be asked to exercise on a treadmill or a stationary bike. You will be given medicines that will make your heart work harder. This is done if you are unable to exercise. When blood flow to your heart has peaked, a tracer will again be given through the IV tube. After 20-40 minutes, you will get back on the exam table. More pictures will be taken of your heart. Depending  on the tracer that is used, more pictures may need to be taken 3-4 hours later. Your IV tube will be removed when the test is over. The test may vary among doctors and hospitals. What happens after the test? Ask your doctor: Whether you can return to your normal schedule, including diet, activities, and medicines. Whether you should drink more fluids. This will help to remove the tracer from your body. Drink enough fluid to keep your pee (urine) pale yellow. Ask your doctor, or the department that is doing the test: When will my results be ready? How will I get my results? Summary A cardiac nuclear scan is a test that is done to check the flow of blood to your heart. Tell your doctor whether you are pregnant or may be pregnant. Before the test, ask your doctor about changing or stopping your normal medicines. This is important. Ask your doctor whether you can return to your normal activities. You may be asked to drink more fluids. This information is not intended to replace advice given to you by your health care provider. Make sure you discuss any   questions you have with your health care provider. Document Revised: 02/23/2019 Document Reviewed: 04/19/2018 Elsevier Patient Education  2021 Elsevier Inc.    Echocardiogram An echocardiogram is a test that uses sound waves (ultrasound) to produce images of the heart. Images from an echocardiogram can provide important information about: Heart size and shape. The size and thickness and movement of your heart's walls. Heart muscle function and strength. Heart valve function or if you have stenosis. Stenosis is when the heart valves are too narrow. If blood is flowing backward through the heart valves (regurgitation). A tumor or infectious growth around the heart valves. Areas of heart muscle that are not working well because of poor blood flow or injury from a heart attack. Aneurysm detection. An aneurysm is a weak or damaged part of an  artery wall. The wall bulges out from the normal force of blood pumping through the body. Tell a health care provider about: Any allergies you have. All medicines you are taking, including vitamins, herbs, eye drops, creams, and over-the-counter medicines. Any blood disorders you have. Any surgeries you have had. Any medical conditions you have. Whether you are pregnant or may be pregnant. What are the risks? Generally, this is a safe test. However, problems may occur, including an allergic reaction to dye (contrast) that may be used during the test. What happens before the test? No specific preparation is needed. You may eat and drink normally. What happens during the test? You will take off your clothes from the waist up and put on a hospital gown. Electrodes or electrocardiogram (ECG)patches may be placed on your chest. The electrodes or patches are then connected to a device that monitors your heart rate and rhythm. You will lie down on a table for an ultrasound exam. A gel will be applied to your chest to help sound waves pass through your skin. A handheld device, called a transducer, will be pressed against your chest and moved over your heart. The transducer produces sound waves that travel to your heart and bounce back (or "echo" back) to the transducer. These sound waves will be captured in real-time and changed into images of your heart that can be viewed on a video monitor. The images will be recorded on a computer and reviewed by your health care provider. You may be asked to change positions or hold your breath for a short time. This makes it easier to get different views or better views of your heart. In some cases, you may receive contrast through an IV in one of your veins. This can improve the quality of the pictures from your heart. The procedure may vary among health care providers and hospitals.    What can I expect after the test? You may return to your normal, everyday  life, including diet, activities, and medicines, unless your health care provider tells you not to do that. Follow these instructions at home: It is up to you to get the results of your test. Ask your health care provider, or the department that is doing the test, when your results will be ready. Keep all follow-up visits. This is important. Summary An echocardiogram is a test that uses sound waves (ultrasound) to produce images of the heart. Images from an echocardiogram can provide important information about the size and shape of your heart, heart muscle function, heart valve function, and other possible heart problems. You do not need to do anything to prepare before this test. You may eat and drink normally. After   the echocardiogram is completed, you may return to your normal, everyday life, unless your health care provider tells you not to do that. This information is not intended to replace advice given to you by your health care provider. Make sure you discuss any questions you have with your health care provider. Document Revised: 06/26/2020 Document Reviewed: 06/26/2020 Elsevier Patient Education  2021 Elsevier Inc.  

## 2022-06-06 NOTE — Progress Notes (Signed)
Cardiology Office Note:    Date:  06/06/2022   ID:  Matthew Sharp, Matthew Sharp Sep 28, 1951, MRN [id number]  PCP:  Pcp, No  Cardiologist:  Garwin Brothers, MD   Referring MD: No ref. provider found    ASSESSMENT:    1. Abdominal aortic atherosclerosis (HCC)   2. Coronary artery disease involving native coronary artery of native heart without angina pectoris   3. Essential hypertension   4. Mixed hyperlipidemia   5. Dyspnea on exertion    PLAN:    In order of problems listed above:  Coronary artery calcification and dyspnea on exertion: He has dyspnea on exertion may Sharp multifactorial however in view of multiple risk factors we will do a Lexiscan sestamibi and he is agreeable.  Secondary prevention stressed.  Importance of compliance with diet medication stressed any vocalized understanding and questions were answered to his satisfaction. Essential hypertension: Blood pressure stable and diet was emphasized. Mixed dyslipidemia: Lipids reviewed.  He is on lipid-lowering medications and doing well. Obesity: Weight reduction stressed.  He has abdominal obesity.  He is trying to do his best to lose weight. History of stroke: He is on Plavix followed by primary care for this. Patient will Sharp seen in follow-up appointment in 6 months or earlier if the patient has any concerns    Medication Adjustments/Labs and Tests Ordered: Current medicines are reviewed at length with the patient today.  Concerns regarding medicines are outlined above.  No orders of the defined types were placed in this encounter.  No orders of the defined types were placed in this encounter.    History of Present Illness:    [name] is a 71 y.o. male who is being seen today for the evaluation of dyspnea on exertion at the request of No ref. provider found.  Patient is a pleasant 71 year old male.  He has past medical history of essential hypertension, dyslipidemia and coronary artery disease diagnosed by three-vessel  coronary artery calcification on CT scan.  He mentions to me that he has had a bad case of COVID for which she was admitted and has developed interstitial lung disease.  Subsequently standpoint.  No chest pain orthopnea or PND.  At the time of my evaluation, the patient is alert awake oriented and in no distress.  Past Medical History:  Diagnosis Date   Abdominal aortic atherosclerosis (HCC)    Achilles tendinitis of left lower extremity    Anxiety    Arteriosclerotic heart disease    Barrett's esophagus    BPH (benign prostatic hyperplasia)    Centrilobular emphysema (HCC)    Chronic back pain    Class 1 obesity due to excess calories with serious comorbidity and body mass index (BMI) of 32.0 to 32.9 in adult    COPD (chronic obstructive pulmonary disease) (HCC)    Coronary artery disease    Degenerative lumbar disc    Depression    Dyspnea on exertion    Elevated PSA    Enthesopathy of ankle and tarsus    Essential hypertension    GERD (gastroesophageal reflux disease)    Hearing loss of right ear    History of COVID-19    History of kidney stones    History of stroke    Hypertension    Hypogonadism in male    ILD (interstitial lung disease) (HCC)    Insomnia    Interstitial lung disease (HCC)    secondary to covid   Irritability and anger  LAFB (left anterior fascicular block)    Major depressive disorder, recurrent (HCC) 03/18/2016   Malaise and fatigue    Mild episode of recurrent major depressive disorder (HCC)    Mixed hyperlipidemia    Myalgia due to statin    OSA (obstructive sleep apnea)    Osteoarthritis    Pneumonia    with COVID   Prediabetes    Retrocalcaneal bursitis, left    RLS (restless legs syndrome)    Sleep apnea    does not use CPAP   Stroke (HCC)    on Plavix, no deficits    Past Surgical History:  Procedure Laterality Date   COLONOSCOPY     DRUG INDUCED ENDOSCOPY N/A 08/14/2021   Procedure: DRUG INDUCED SLEEP ENDOSCOPY;  Surgeon: Christia Reading, MD;  Location: [facility];  Service: ENT;  Laterality: N/A;   IMPLANTATION OF HYPOGLOSSAL NERVE STIMULATOR N/A 11/20/2021   Procedure: IMPLANTATION OF HYPOGLOSSAL NERVE STIMULATOR;  Surgeon: Christia Reading, MD;  Location: [facility] OR;  Service: ENT;  Laterality: N/A;    Current Medications: Current Meds  Medication Sig   acetaminophen (TYLENOL) 500 MG tablet Take 500 mg by mouth every 6 (six) hours as needed for headache or pain.   albuterol (VENTOLIN HFA) 108 (90 Base) MCG/ACT inhaler Inhale 2 puffs into the lungs every 6 (six) hours as needed for wheezing or shortness of breath.   ALPRAZolam (XANAX) 0.5 MG tablet Take 0.5 mg by mouth at bedtime as needed for anxiety.   budesonide-formoterol (SYMBICORT) 160-4.5 MCG/ACT inhaler Inhale 2 puffs into the lungs 2 (two) times daily.   clopidogrel (PLAVIX) 75 MG tablet Take 75 mg by mouth daily.   finasteride (PROSCAR) 5 MG tablet Take 5 mg by mouth daily.   fluticasone (FLONASE) 50 MCG/ACT nasal spray Place 1 spray into both nostrils daily.   MAGNESIUM PO Take 1 tablet by mouth daily.   metoprolol succinate (TOPROL-XL) 50 MG 24 hr tablet Take 50 mg by mouth daily. Take with or immediately following a meal.   potassium chloride (MICRO-K) 10 MEQ CR capsule Take 10 mEq by mouth daily.   rosuvastatin (CRESTOR) 10 MG tablet Take 10 mg by mouth daily.   sertraline (ZOLOFT) 100 MG tablet Take 150 mg by mouth daily.   tadalafil (CIALIS) 5 MG tablet Take 5 mg by mouth at bedtime.   tamsulosin (FLOMAX) 0.4 MG CAPS capsule Take 0.4 mg by mouth at bedtime.   traMADol (ULTRAM) 50 MG tablet Take 50-100 mg by mouth every 6 (six) hours as needed for severe pain.     Allergies:   Ciprofloxacin and Statins   Social History   Socioeconomic History   Marital status: Married    Spouse name: Not on file   Number of children: Not on file   Years of education: Not on file   Highest education level: Not on file  Occupational History   Not on file   Tobacco Use   Smoking status: Former    Types: Cigarettes   Smokeless tobacco: Never  Substance and Sexual Activity   Alcohol use: Never   Drug use: Never   Sexual activity: Not on file  Other Topics Concern   Not on file  Social History Narrative   Not on file   Social Determinants of Health   Financial Resource Strain: Not on file  Food Insecurity: Not on file  Transportation Needs: Not on file  Physical Activity: Not on file  Stress: Not on file  Social Connections: Not on file     Family History: The patient's family history is negative for Hypertension, Heart disease, Diabetes, and Cancer.  ROS:   Please see the history of present illness.    All other systems reviewed and are negative.  EKGs/Labs/Other Studies Reviewed:    The following studies were reviewed today: EKG reveals sinus rhythm with nonspecific ST-T changes   Recent Labs: 11/19/2021: BUN 13; Creatinine, Ser 1.24; Hemoglobin 16.4; Platelets 327; Potassium 3.5; Sodium 137  Recent Lipid Panel No results found for: "CHOL", "TRIG", "HDL", "CHOLHDL", "VLDL", "LDLCALC", "LDLDIRECT"  Physical Exam:    VS:  BP 117/76   Pulse 88   Ht 5\' 8"  (1.727 m)   Wt 207 lb 6.4 oz (94.1 kg)   SpO2 97%   BMI 31.54 kg/m     Wt Readings from Last 3 Encounters:  06/06/22 207 lb 6.4 oz (94.1 kg)  11/20/21 207 lb (93.9 kg)  11/19/21 207 lb (93.9 kg)     GEN: Patient is in no acute distress HEENT: Normal NECK: No JVD; No carotid bruits LYMPHATICS: No lymphadenopathy CARDIAC: S1 S2 regular, 2/6 systolic murmur at the apex. RESPIRATORY:  Clear to auscultation without rales, wheezing or rhonchi  ABDOMEN: Soft, non-tender, non-distended MUSCULOSKELETAL:  No edema; No deformity  SKIN: Warm and dry NEUROLOGIC:  Alert and oriented x 3 PSYCHIATRIC:  Normal affect    Signed, 01/17/22, MD  06/06/2022 11:40 AM    [facility]

## 2022-06-20 ENCOUNTER — Ambulatory Visit: Payer: PPO | Admitting: Cardiology

## 2022-06-24 ENCOUNTER — Telehealth: Payer: Self-pay | Admitting: *Deleted

## 2022-06-24 NOTE — Telephone Encounter (Signed)
Left message on voicemail per DPR in reference to upcoming appointment scheduled on 0815/23 at 0745 with detailed instructions given per Myocardial Perfusion Study Information Sheet for the test. LM to arrive 15 minutes early, and that it is imperative to arrive on time for appointment to keep from having the test rescheduled. If you need to cancel or reschedule your appointment, please call the office within 24 hours of your appointment. Failure to do so may result in a cancellation of your appointment, and a $50 no show fee. Phone number given for call back for any questions.  Corrina Steffensen, Adelene Idler

## 2022-06-25 ENCOUNTER — Telehealth (HOSPITAL_COMMUNITY): Payer: Self-pay | Admitting: *Deleted

## 2022-06-25 NOTE — Telephone Encounter (Signed)
Left message on voicemail per DPR in reference to upcoming appointment scheduled on 07/01/2022 at 7:45 with detailed instructions given per Myocardial Perfusion Study Information Sheet for the test. LM to arrive 15 minutes early, and that it is imperative to arrive on time for appointment to keep from having the test rescheduled. If you need to cancel or reschedule your appointment, please call the office within 24 hours of your appointment. Failure to do so may result in a cancellation of your appointment, and a $50 no show fee. Phone number given for call back for any questions.

## 2022-07-01 ENCOUNTER — Ambulatory Visit (INDEPENDENT_AMBULATORY_CARE_PROVIDER_SITE_OTHER): Payer: PPO

## 2022-07-01 ENCOUNTER — Telehealth: Payer: Self-pay | Admitting: *Deleted

## 2022-07-01 DIAGNOSIS — R0609 Other forms of dyspnea: Secondary | ICD-10-CM

## 2022-07-01 LAB — MYOCARDIAL PERFUSION IMAGING
LV dias vol: 63 mL (ref 62–150)
LV sys vol: 25 mL
Nuc Stress EF: 61 %
Peak HR: 78 {beats}/min
Rest HR: 61 {beats}/min
Rest Nuclear Isotope Dose: 10.7 mCi
SDS: 0
SRS: 1
SSS: 1
ST Depression (mm): 0 mm
Stress Nuclear Isotope Dose: 32.4 mCi
TID: 0.86

## 2022-07-01 LAB — ECHOCARDIOGRAM COMPLETE
Area-P 1/2: 3.19 cm2
Height: 68 in
S' Lateral: 3.2 cm
Weight: 3312 oz

## 2022-07-01 MED ORDER — REGADENOSON 0.4 MG/5ML IV SOLN
0.4000 mg | Freq: Once | INTRAVENOUS | Status: AC
  Administered 2022-07-01: 0.4 mg via INTRAVENOUS

## 2022-07-01 MED ORDER — TECHNETIUM TC 99M TETROFOSMIN IV KIT
32.4000 | PACK | Freq: Once | INTRAVENOUS | Status: AC | PRN
  Administered 2022-07-01: 32.4 via INTRAVENOUS

## 2022-07-01 MED ORDER — TECHNETIUM TC 99M TETROFOSMIN IV KIT
10.7000 | PACK | Freq: Once | INTRAVENOUS | Status: AC | PRN
  Administered 2022-07-01: 10.7 via INTRAVENOUS

## 2022-07-01 MED ORDER — PERFLUTREN LIPID MICROSPHERE
1.0000 mL | INTRAVENOUS | Status: AC | PRN
  Administered 2022-07-01: 4 mL via INTRAVENOUS

## 2022-07-01 NOTE — Telephone Encounter (Signed)
   Pre-operative Risk Assessment    Patient Name: Matthew Sharp  DOB: 13-Mar-1951 MRN: [id number]      Request for Surgical Clearance    Procedure:   CATARACT EXTRACTION BY PE, IOL-RIGHT EYE THEN LEFT EYE  Date of Surgery:  Clearance TBD                                 Surgeon:  DR. Mack Hook Surgeon's Group or Practice Name:  [facility]  Phone number:  [phone] [phone] Fax number:  [phone]   Type of Clearance Requested:   - Medical ; NO MEDICATIONS ARE LISTED AS NEEDING TO BE HELD   Type of Anesthesia:   IV SEDATION   Additional requests/questions:    Elpidio Anis   07/01/2022, 6:12 PM

## 2022-07-02 NOTE — Telephone Encounter (Signed)
   Patient Name: EUAL LINDSTROM  DOB: 09-16-1951 MRN: [id number]  Primary Cardiologist: None  Chart reviewed as part of pre-operative protocol coverage. Cataract extractions are recognized in guidelines as low risk surgeries that do not typically require specific preoperative testing or holding of blood thinner therapy. Therefore, given past medical history and time since last visit, based on ACC/AHA guidelines, DRAVEN NATTER would be at acceptable risk for the planned procedure without further cardiovascular testing.   I will route this recommendation to the requesting party via Epic fax function and remove from pre-op pool.  Please call with questions.  Joylene Grapes, NP 07/02/2022, 11:52 AM

## 2022-09-08 IMAGING — DX DG NECK SOFT TISSUE
1 series · 1 of 1 positions shown · non-contrast
Comparison: Chest x-ray of the same date.

CLINICAL DATA: Postop placement of nerve stimulator.

EXAM:
NECK SOFT TISSUES - 1+ VIEW

[neck lat]
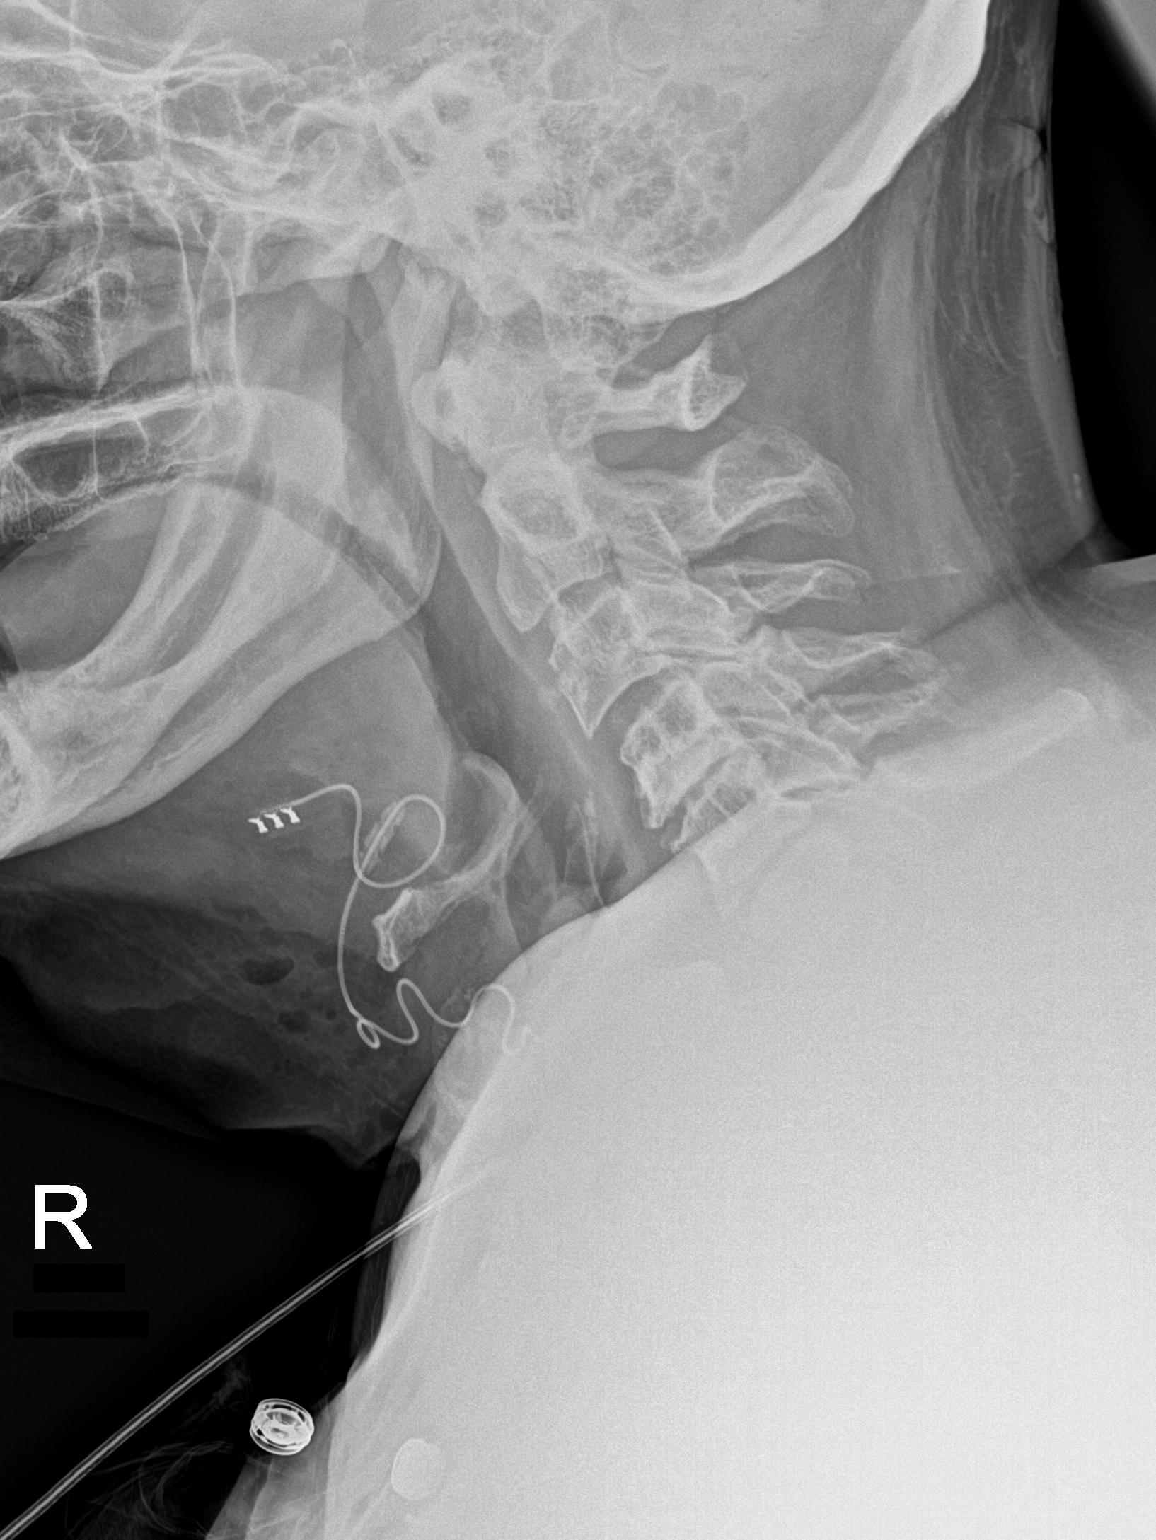

[1 of 1 positions shown; findings below may reference images not displayed]

FINDINGS: Single projection, lateral view provided at the request of the
referring physician of the soft tissues of the neck shows a nerve
stimulator lead projecting superior to the hyoid and below of the
mandible. The lead travels from inferior to superior with redundant
looping of the lead wire.

Prevertebral soft tissues are normal. Small amounts of gas are seen
in the anterior neck related to lead placement.

Degenerative changes are noted in the cervical spine which is only
partially visualized.
IMPRESSION: Nerve stimulator lead placement as described with redundant looping
of the lead wire in the anterior neck.

Expected soft tissue gas following recent surgery to the neck as
described.

Only lateral view of the neck is provided. See concurrent chest
radiograph. If there is a desire to capture the lead placement in
the neck in the frontal projection repeat imaging could be performed
as warranted.

## 2022-09-08 IMAGING — DX DG CHEST 1V
1 series · 1 of 1 positions shown · non-contrast
Comparison: Chest radiograph 01/18/2020

CLINICAL DATA: Postop placement of sleep apnea device

EXAM:
CHEST  1 VIEW

[chest ap]
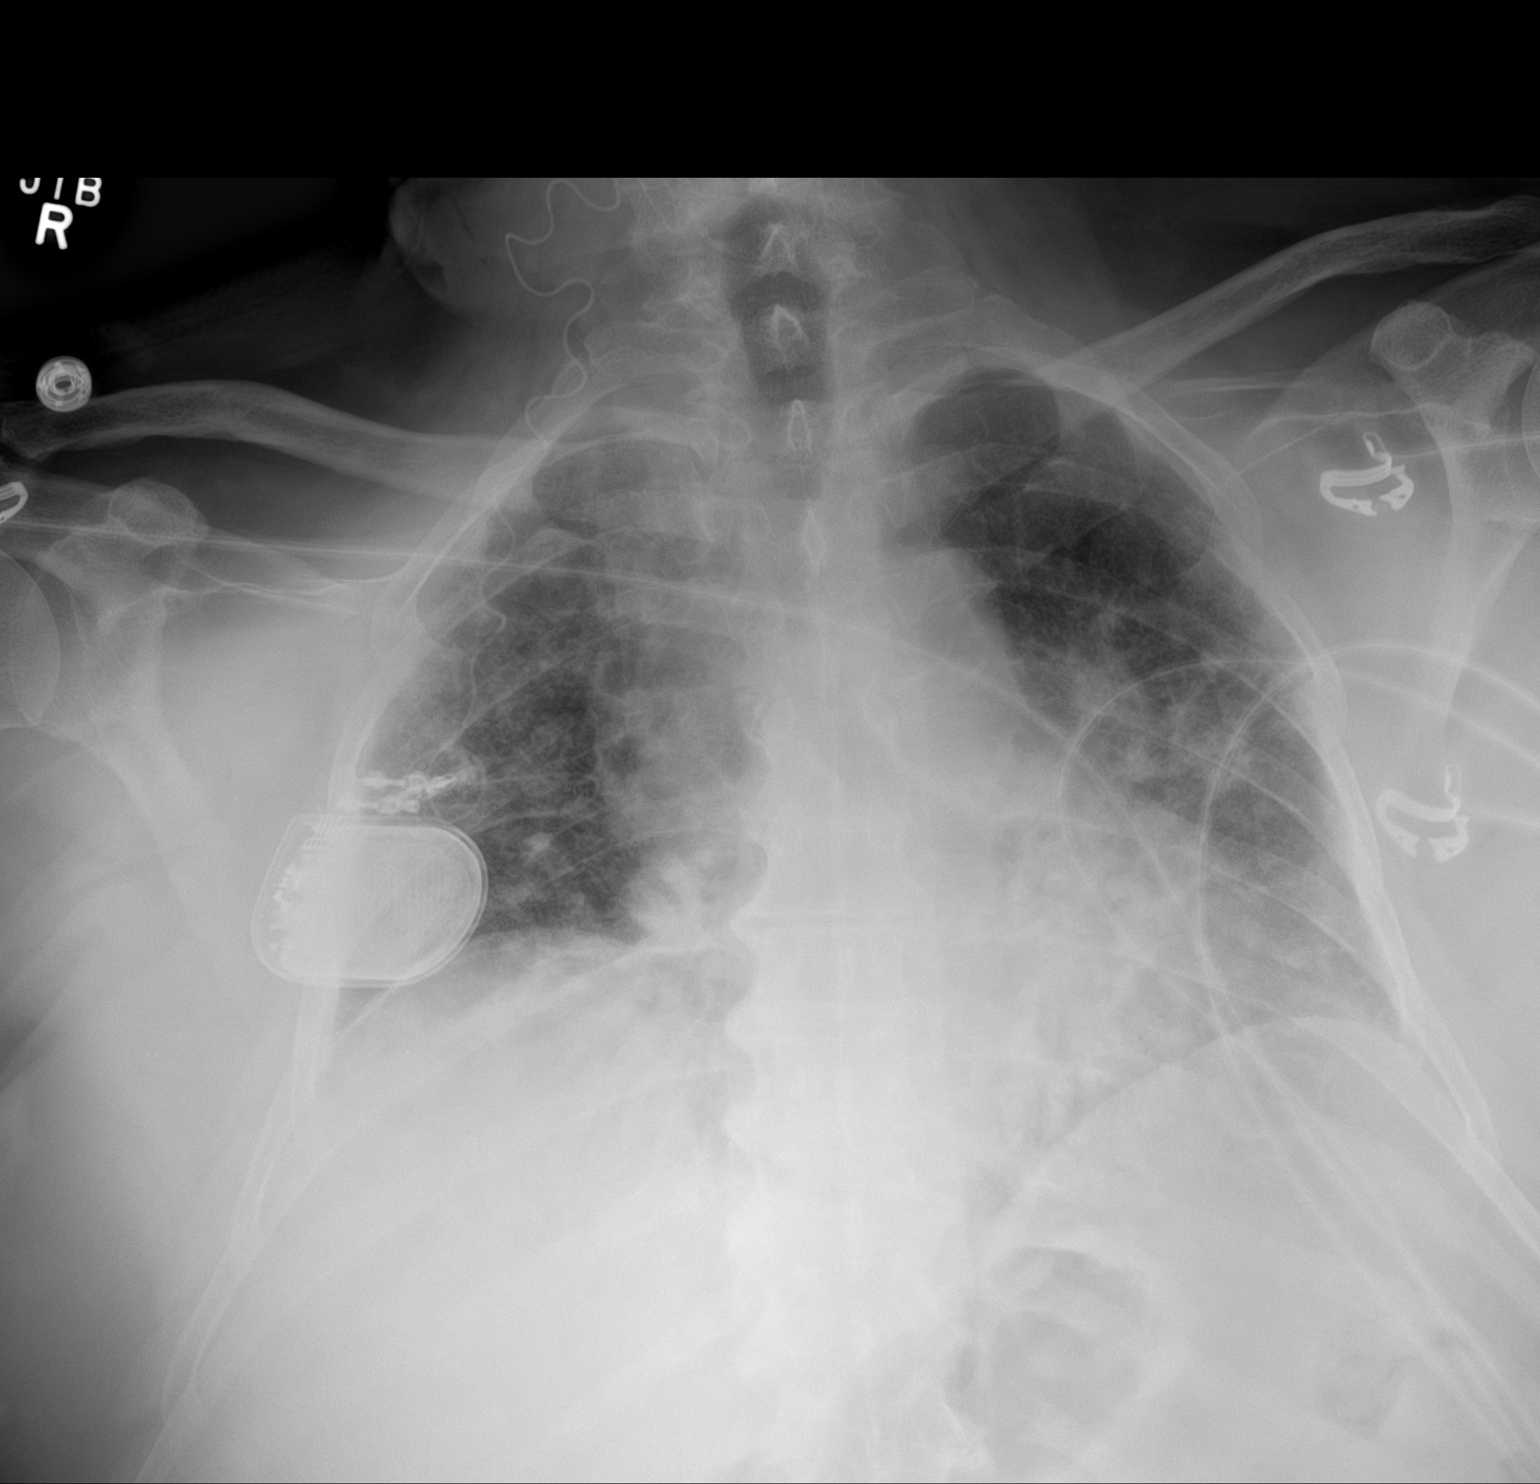

[1 of 1 positions shown; findings below may reference images not displayed]

FINDINGS: There is a new right chest wall device with a lead coursing
superiorly over the right hemithorax into the soft tissues of the
neck. The end of the lead is assessed on the separately dictated
neck radiograph. There is a small amount of soft tissue gas in the
neck.

The heart is enlarged. The upper mediastinal contours are prominent,
exaggerated by low lung volumes and AP technique.

There is unchanged asymmetric elevation of the right hemidiaphragm.
Are patchy opacities in the left perihilar region which appear
increased compared to the prior radiograph from 1915. Otherwise,
there is no focal consolidation. There is no overt pulmonary edema.
There is no pleural effusion or pneumothorax.

There is no acute osseous abnormality.
IMPRESSION: 1. New right chest wall device with a lead coursing superiorly over
the right hemithorax into the neck. The distal end of the lead is
assessed on the separately dictated neck radiograph. There is a
small amount of soft tissue gas but no evidence of complication. No
pneumothorax.
2. Patchy left perihilar opacities could reflect infection or
asymmetric edema in the correct clinical setting. Recommend
follow-up radiographs in 3-4 weeks to assess for resolution.
3. Cardiomegaly.

## 2023-03-09 ENCOUNTER — Other Ambulatory Visit: Payer: Self-pay | Admitting: Nurse Practitioner

## 2023-03-09 DIAGNOSIS — R972 Elevated prostate specific antigen [PSA]: Secondary | ICD-10-CM

## 2023-04-24 ENCOUNTER — Ambulatory Visit (HOSPITAL_COMMUNITY)
Admission: RE | Admit: 2023-04-24 | Discharge: 2023-04-24 | Disposition: A | Discharge status: Home / Self Care | Payer: PPO | Source: Ambulatory Visit | Attending: Nurse Practitioner | Admitting: Nurse Practitioner

## 2023-04-24 DIAGNOSIS — R972 Elevated prostate specific antigen [PSA]: Secondary | ICD-10-CM | POA: Insufficient documentation

## 2023-04-25 MED ORDER — GADOBUTROL 1 MMOL/ML IV SOLN
10.0000 mL | Freq: Once | INTRAVENOUS | Status: AC | PRN
  Administered 2023-04-25: 10 mL via INTRAVENOUS

## 2023-07-10 ENCOUNTER — Telehealth: Payer: Self-pay | Admitting: Radiation Oncology

## 2023-07-10 NOTE — Telephone Encounter (Signed)
8/23 @ 3:01 pm Left voicemail on patient and patient's wife contact number for patient to call our office to be schedule for consult.

## 2023-07-21 ENCOUNTER — Encounter: Payer: Self-pay | Admitting: Radiation Oncology

## 2023-07-21 NOTE — Progress Notes (Addendum)
GU Location of Tumor / Histology: Prostate Ca  If Prostate Cancer, Gleason Score is (3 + 4) and PSA is (4.35 on 01/14/2022)  Biopsies      04/24/2023 Cheri Rous, NP MR Prostate with/without Contrast CLINICAL DATA:  Elevated PSA level of 4.8. Previous biopsy 05/21/2022 revealed high-grade PIN in the left lateral apical, right apical, right lateral mid gland, with some a atypical glands at the left apex. R97.20  IMPRESSION: 1. Small PI-RADS category 4 lesion of the left posterolateral peripheral zone in the mid gland. Targeting data sent to [facility]. 2. Mild prostatomegaly.   Past/Anticipated interventions by urology, if any:   06/23/2023 Dr. Debroah Baller   Past/Anticipated interventions by medical oncology, if any: NA  Weight changes, if any: no  IPSS: 1 SHIM: 1  Bowel/Bladder complaints, if any: no   Nausea/Vomiting, if any: no  Pain issues, if any:  no  SAFETY ISSUES: Prior radiation? no Pacemaker/ICD? no Possible current pregnancy? Male Is the patient on methotrexate? No  Current Complaints / other details:  He is ready to start treatment.

## 2023-07-23 ENCOUNTER — Encounter: Payer: Self-pay | Admitting: Radiation Oncology

## 2023-07-24 ENCOUNTER — Ambulatory Visit
Admission: RE | Admit: 2023-07-24 | Discharge: 2023-07-24 | Disposition: A | Discharge status: Home / Self Care | Payer: PPO | Source: Ambulatory Visit | Attending: Radiation Oncology | Admitting: Radiation Oncology

## 2023-07-24 ENCOUNTER — Encounter: Payer: Self-pay | Admitting: Radiation Oncology

## 2023-07-24 ENCOUNTER — Other Ambulatory Visit: Payer: Self-pay

## 2023-07-24 VITALS — Wt 205.0 lb

## 2023-07-24 DIAGNOSIS — C61 Malignant neoplasm of prostate: Secondary | ICD-10-CM | POA: Insufficient documentation

## 2023-07-24 NOTE — Progress Notes (Signed)
Radiation Oncology         [phone] ________________________________  Initial Outpatient Consultation- Conducted via telephone due to current COVID-19 concerns for limiting patient exposure  Name: Matthew Sharp MRN: [id number]  Date: 07/24/2023  DOB: May 01, 1951  WU:[id number], Evlyn Courier., MD  Debroah Baller, MD   REFERRING PHYSICIAN: Debroah Baller, MD  DIAGNOSIS: 72 y.o. gentleman with Stage T1c adenocarcinoma of the prostate with Gleason score of 3+4, and PSA of 4.8.    ICD-10-CM   1. Malignant neoplasm of prostate (HCC)  C61       HISTORY OF PRESENT ILLNESS: Matthew Sharp is a 72 y.o. male with a diagnosis of prostate cancer. He was initially noted to have an elevated PSA of 4.7 by his primary care physician, Dr. Shary Decamp.  Accordingly, he was referred for evaluation in urology by Dr. Saddie Benders in 2023,  digital rectal examination performed at that time showed no discrete nodules.  He had a biopsy on 05/28/22 that was negative. His PSA remained elevated on repeat in 02/2023 so a prostate MRI was performed on 04/24/2023 for further evaluation.  This showed a PI-RADS 4 lesion in the left posterior lateral peripheral zone, mid gland.  The patient was referred to Dr. Cardell Peach at [facility] urology for a MRI fusion transrectal ultrasound with 16 biopsies of the prostate on 06/16/2023.  The prostate volume measured 45 cc.  Out of 16 core biopsies, 5 were positive.  The maximum Gleason score was 3+4, and this was seen in 3 of 4 cores from the MRI ROI as well as the right apex and right apex lateral.  He had a CT A/P on 07/06/2023 for disease staging and this did not show any evidence of metastatic disease.  The patient reviewed the biopsy and imaging results with his urologist and he has kindly been referred today for discussion of potential radiation treatment options.   PREVIOUS RADIATION THERAPY: No  PAST MEDICAL HISTORY:  Past Medical History:  Diagnosis Date   Abdominal aortic atherosclerosis (HCC)    Achilles  tendinitis of left lower extremity    Anxiety    Arteriosclerotic heart disease    Barrett's esophagus    BPH (benign prostatic hyperplasia)    Centrilobular emphysema (HCC)    Chronic back pain    Class 1 obesity due to excess calories with serious comorbidity and body mass index (BMI) of 32.0 to 32.9 in adult    COPD (chronic obstructive pulmonary disease) (HCC)    Coronary artery disease    Degenerative lumbar disc    Depression    Dyspnea on exertion    Elevated PSA    Enthesopathy of ankle and tarsus    Essential hypertension    GERD (gastroesophageal reflux disease)    Hearing loss of right ear    History of COVID-19    History of kidney stones    History of stroke    Hypertension    Hypogonadism in male    ILD (interstitial lung disease) (HCC)    Insomnia    Interstitial lung disease (HCC)    secondary to covid   Irritability and anger    LAFB (left anterior fascicular block)    Major depressive disorder, recurrent (HCC) 03/18/2016   Malaise and fatigue    Mild episode of recurrent major depressive disorder (HCC)    Mixed hyperlipidemia    Myalgia due to statin    OSA (obstructive sleep apnea)    Osteoarthritis    Pneumonia  with COVID   Prediabetes    Retrocalcaneal bursitis, left    RLS (restless legs syndrome)    Sleep apnea    does not use CPAP   Stroke (HCC)    on Plavix, no deficits      PAST SURGICAL HISTORY: Past Surgical History:  Procedure Laterality Date   COLONOSCOPY     DRUG INDUCED ENDOSCOPY N/A 08/14/2021   Procedure: DRUG INDUCED SLEEP ENDOSCOPY;  Surgeon: Christia Reading, MD;  Location: [facility];  Service: ENT;  Laterality: N/A;   IMPLANTATION OF HYPOGLOSSAL NERVE STIMULATOR N/A 11/20/2021   Procedure: IMPLANTATION OF HYPOGLOSSAL NERVE STIMULATOR;  Surgeon: Christia Reading, MD;  Location: [facility] OR;  Service: ENT;  Laterality: N/A;   LITHOTRIPSY  01/21/2019   PROSTATE BIOPSY      FAMILY HISTORY:  Family History  Problem  Relation Age of Onset   Heart attack Mother    Heart attack Brother    Hypertension Neg Hx    Heart disease Neg Hx    Diabetes Neg Hx    Cancer Neg Hx     SOCIAL HISTORY:  Social History   Socioeconomic History   Marital status: Married    Spouse name: Not on file   Number of children: Not on file   Years of education: Not on file   Highest education level: Not on file  Occupational History   Not on file  Tobacco Use   Smoking status: Former    Current packs/day: 0.00    Types: Cigarettes    Quit date: 1999    Years since quitting: 25.6   Smokeless tobacco: Never  Substance and Sexual Activity   Alcohol use: Not Currently   Drug use: Never   Sexual activity: Not on file  Other Topics Concern   Not on file  Social History Narrative   Not on file   Social Determinants of Health   Financial Resource Strain: Not on file  Food Insecurity: No Food Insecurity (07/24/2023)   Hunger Vital Sign    Worried About Running Out of Food in the Last Year: Never true    Ran Out of Food in the Last Year: Never true  Transportation Needs: No Transportation Needs (07/22/2023)   Received from [employer]    In the past 12 months, has lack of reliable transportation kept you from medical appointments, meetings, work or from getting things needed for daily living? : No  Physical Activity: Not on file  Stress: Not on file  Social Connections: Not on file  Intimate Partner Violence: Not At Risk (07/24/2023)   Humiliation, Afraid, Rape, and Kick questionnaire    Fear of Current or Ex-Partner: No    Emotionally Abused: No    Physically Abused: No    Sexually Abused: No    ALLERGIES: Ciprofloxacin and Statins  MEDICATIONS:  Current Outpatient Medications  Medication Sig Dispense Refill   acetaminophen (TYLENOL) 500 MG tablet Take 500 mg by mouth every 6 (six) hours as needed for headache or pain.     albuterol (VENTOLIN HFA) 108 (90 Base) MCG/ACT inhaler Inhale 2 puffs  into the lungs every 6 (six) hours as needed for wheezing or shortness of breath.     ALPRAZolam (XANAX) 0.5 MG tablet Take 0.5 mg by mouth at bedtime as needed for anxiety.     ascorbic acid (VITAMIN C) 500 MG tablet Take 500 mg by mouth daily.     budesonide-formoterol (SYMBICORT) 160-4.5 MCG/ACT inhaler Inhale  2 puffs into the lungs 2 (two) times daily.     clopidogrel (PLAVIX) 75 MG tablet Take 75 mg by mouth daily.     finasteride (PROSCAR) 5 MG tablet Take 5 mg by mouth daily.     fluticasone (FLONASE) 50 MCG/ACT nasal spray Place 1 spray into both nostrils daily.     ketorolac (ACULAR) 0.5 % ophthalmic solution Place 1 drop into both eyes once.     metoprolol succinate (TOPROL-XL) 50 MG 24 hr tablet Take 50 mg by mouth daily. Take with or immediately following a meal.     potassium chloride (MICRO-K) 10 MEQ CR capsule Take 10 mEq by mouth daily.     predniSONE (DELTASONE) 20 MG tablet Take 1 tablet by mouth daily.     rosuvastatin (CRESTOR) 10 MG tablet Take 10 mg by mouth daily.     sertraline (ZOLOFT) 100 MG tablet Take 150 mg by mouth daily.     tadalafil (CIALIS) 5 MG tablet Take 5 mg by mouth at bedtime.     tamsulosin (FLOMAX) 0.4 MG CAPS capsule Take 0.4 mg by mouth at bedtime.     Tiotropium Bromide-Olodaterol 2.5-2.5 MCG/ACT AERS Inhale 2.5 mcg into the lungs daily.     traMADol (ULTRAM) 50 MG tablet Take 50-100 mg by mouth every 6 (six) hours as needed for severe pain.     vitamin B-12 (CYANOCOBALAMIN) 50 MCG tablet Take 50 mcg by mouth daily.     furosemide (LASIX) 40 MG tablet Take 40 mg by mouth daily. (Patient not taking: Reported on 07/24/2023)     MAGNESIUM PO Take 1 tablet by mouth daily. (Patient not taking: Reported on 07/24/2023)     No current facility-administered medications for this encounter.    REVIEW OF SYSTEMS:  On review of systems, the patient reports that he is doing well overall. He denies any chest pain, shortness of breath, cough, fevers, chills, night  sweats, unintended weight changes. He denies any bowel disturbances, and denies abdominal pain, nausea or vomiting. He denies any new musculoskeletal or joint aches or pains. His IPSS was 1, indicating very mild urinary symptoms with nocturia x 1 only. His SHIM was 1, indicating he has severe erectile dysfunction. A complete review of systems is obtained and is otherwise negative.    PHYSICAL EXAM:  Wt Readings from Last 3 Encounters:  07/24/23 205 lb (93 kg)  07/01/22 207 lb (93.9 kg)  06/06/22 207 lb 6.4 oz (94.1 kg)   Temp Readings from Last 3 Encounters:  11/20/21 98 F (36.7 C)  11/19/21 98.3 F (36.8 C) (Oral)  08/14/21 98.1 F (36.7 C)   BP Readings from Last 3 Encounters:  06/06/22 117/76  11/20/21 (!) 146/93  11/19/21 119/74   Pulse Readings from Last 3 Encounters:  06/06/22 88  11/20/21 60  11/19/21 70   Pain Assessment Pain Score: 0-No pain/10  Unable to assess due to telephone consult visit format.   KPS = 100  100 - Normal; no complaints; no evidence of disease. 90   - Able to carry on normal activity; minor signs or symptoms of disease. 80   - Normal activity with effort; some signs or symptoms of disease. 28   - Cares for self; unable to carry on normal activity or to do active work. 60   - Requires occasional assistance, but is able to care for most of his personal needs. 50   - Requires considerable assistance and frequent medical care. 40   -  Disabled; requires special care and assistance. 30   - Severely disabled; hospital admission is indicated although death not imminent. 20   - Very sick; hospital admission necessary; active supportive treatment necessary. 10   - Moribund; fatal processes progressing rapidly. 0     - Dead  Karnofsky DA, Abelmann WH, Craver LS and [facility] [phone]) The use of the nitrogen mustards in the palliative treatment of carcinoma: with particular reference to bronchogenic carcinoma Cancer 1 634-56  LABORATORY DATA:  Lab  Results  Component Value Date   WBC 11.2 (H) 11/19/2021   HGB 16.4 11/19/2021   HCT 48.3 11/19/2021   MCV 89.0 11/19/2021   PLT 327 11/19/2021   Lab Results  Component Value Date   NA 137 11/19/2021   K 3.5 11/19/2021   CL 100 11/19/2021   CO2 27 11/19/2021   No results found for: "ALT", "AST", "GGT", "ALKPHOS", "BILITOT"   RADIOGRAPHY: No results found.    IMPRESSION/PLAN:  This visit was conducted via telephone to spare the patient unnecessary potential exposure in the healthcare setting during the current COVID-19 pandemic. 1. 72 y.o. gentleman with Stage T1c adenocarcinoma of the prostate with Gleason Score of 3+4, and PSA of 4.8. We discussed the patient's workup and outlined the nature of prostate cancer in this setting. The patient's T stage, Gleason's score, and PSA put him into the favorable intermediate risk group. Accordingly, he is eligible for a variety of potential treatment options including brachytherapy, 5.5 weeks of external radiation, or prostatectomy. We discussed the available radiation techniques, and focused on the details and logistics of delivery. We discussed and outlined the risks, benefits, short and long-term effects associated with radiotherapy and compared and contrasted these with prostatectomy. He appears to have a good understanding of his disease and our treatment recommendations which are of curative intent.  He was encouraged to ask questions that were answered to his stated satisfaction.  At the conclusion of our conversation, the patient is interested in moving forward with brachytherapy and use of SpaceOAR gel to reduce rectal toxicity from radiotherapy.  We will share our discussion with Dr. Saddie Benders and move forward with scheduling his CT [facility] planning appointment in the near future.  The patient will be contacted by Darryl Nestle in our office who will be working closely with him to coordinate OR scheduling and pre and post procedure appointments.  We  enjoyed meeting him today and look forward to continuing to participate in his care.  Given current concerns for patient exposure during the COVID-19 pandemic, this encounter was conducted via telephone. The patient was notified in advance and was offered a WebEX meeting to allow for face to face communication but unfortunately reported that he/she did not have the appropriate resources/technology to support such a visit and instead preferred to proceed with telephone consult. The patient has given verbal consent for this type of encounter. The attendants for this meeting include Margaretmary Dys MD, Marcello Fennel PA-C,and patient, Matthew Sharp. During the encounter, Margaretmary Dys MD, and Farrel Conners, were located at [facility] Radiation Oncology Department.  Patient, Matthew Sharp, was located at home.   We personally spent 60 minutes in this encounter including chart review, reviewing radiological studies, meeting face-to-face with the patient, entering orders and completing documentation.    Marguarite Arbour, PA-C    Margaretmary Dys, MD  [facility]  Radiation Oncology Direct Dial: [phone]  Fax: [phone] [email or web address]  Skype  LinkedIn

## 2023-07-29 ENCOUNTER — Telehealth: Payer: Self-pay | Admitting: *Deleted

## 2023-07-29 NOTE — Telephone Encounter (Signed)
CALLED PATIENT TO ASK QUESTIONS, SPOKE WITH PATIENT 

## 2023-08-04 ENCOUNTER — Telehealth: Payer: Self-pay | Admitting: *Deleted

## 2023-08-04 NOTE — Telephone Encounter (Signed)
Returned patient's phone call, spoke with patient

## 2023-08-10 NOTE — Progress Notes (Signed)
RN left message with my direct number to follow up with any questions or barriers prior to upcoming brachytherapy scheduled for 09/24/23.

## 2023-09-02 ENCOUNTER — Telehealth: Payer: Self-pay | Admitting: *Deleted

## 2023-09-02 NOTE — Telephone Encounter (Signed)
CALLED PATIENT TO REMIND OF PRE-SEED APPTS. FOR 09-03-23, SPOKE WITH PATIENT AND HE IS AWARE OF THESE APPTS.

## 2023-09-02 NOTE — Progress Notes (Signed)
Radiation Oncology         [phone] ________________________________  Outpatient Follow up- Pre-seed visit  Name: Matthew Sharp MRN: [id number]  Date: 09/03/2023  DOB: [birth year]  PI:[email or web address], Evlyn Courier., MD  Debroah Baller, MD   REFERRING PHYSICIAN: Debroah Baller, MD  DIAGNOSIS: 72 y.o. gentleman with Stage T1c adenocarcinoma of the prostate with Gleason score of 3+4, and PSA of 4.8.     ICD-10-CM   1. Malignant neoplasm of prostate (HCC)  C61       HISTORY OF PRESENT ILLNESS: Matthew Sharp is a 72 y.o. male with a diagnosis of prostate cancer. He was initially noted to have an elevated PSA of 4.7 by his primary care physician, Dr. Shary Decamp.  Accordingly, he was referred for evaluation in urology by Dr. Saddie Benders in 2023,  digital rectal examination performed at that time showed no discrete nodules.  He had a biopsy on 05/28/22 that was negative. His PSA remained elevated on repeat in 02/2023 so a prostate MRI was performed on 04/24/2023 for further evaluation.  This showed a PI-RADS 4 lesion in the left posterior lateral peripheral zone, mid gland.  The patient was referred to Dr. Cardell Peach at [facility] urology for a MRI fusion transrectal ultrasound with 16 biopsies of the prostate on 06/16/2023.  The prostate volume measured 45 cc.  Out of 16 core biopsies, 5 were positive.  The maximum Gleason score was 3+4, and this was seen in 3 of 4 cores from the MRI ROI as well as the right apex and right apex lateral.   He had a CT A/P on 07/06/2023 for disease staging and this did not show any evidence of metastatic disease.  The patient reviewed the biopsy results with his urologist and was kindly referred to [place] for discussion of potential radiation treatment options. We had a telephone consult with the patient on 07/24/23 and he was most interested in proceeding with brachytherapy for treatment of his disease. He is here today for his pre-procedure imaging for planning and to answer any additional questions he may have  about this treatment.   PREVIOUS RADIATION THERAPY: No  PAST MEDICAL HISTORY:  Past Medical History:  Diagnosis Date   Abdominal aortic atherosclerosis (HCC)    Achilles tendinitis of left lower extremity    Anxiety    Arteriosclerotic heart disease    Barrett's esophagus    BPH (benign prostatic hyperplasia)    Centrilobular emphysema (HCC)    Chronic back pain    Class 1 obesity due to excess calories with serious comorbidity and body mass index (BMI) of 32.0 to 32.9 in adult    COPD (chronic obstructive pulmonary disease) (HCC)    Coronary artery disease    Degenerative lumbar disc    Depression    Dyspnea on exertion    Elevated PSA    Enthesopathy of ankle and tarsus    Essential hypertension    GERD (gastroesophageal reflux disease)    Hearing loss of right ear    History of COVID-19    History of kidney stones    History of stroke    Hypertension    Hypogonadism in male    ILD (interstitial lung disease) (HCC)    Insomnia    Interstitial lung disease (HCC)    secondary to covid   Irritability and anger    LAFB (left anterior fascicular block)    Major depressive disorder, recurrent (HCC) 03/18/2016   Malaise and fatigue    Mild episode  of recurrent major depressive disorder (HCC)    Mixed hyperlipidemia    Myalgia due to statin    OSA (obstructive sleep apnea)    Osteoarthritis    Pneumonia    with COVID   Prediabetes    Retrocalcaneal bursitis, left    RLS (restless legs syndrome)    Sleep apnea    does not use CPAP   Stroke (HCC)    on Plavix, no deficits      PAST SURGICAL HISTORY: Past Surgical History:  Procedure Laterality Date   COLONOSCOPY     DRUG INDUCED ENDOSCOPY N/A 08/14/2021   Procedure: DRUG INDUCED SLEEP ENDOSCOPY;  Surgeon: Christia Reading, MD;  Location: [facility];  Service: ENT;  Laterality: N/A;   IMPLANTATION OF HYPOGLOSSAL NERVE STIMULATOR N/A 11/20/2021   Procedure: IMPLANTATION OF HYPOGLOSSAL NERVE STIMULATOR;   Surgeon: Christia Reading, MD;  Location: [facility] OR;  Service: ENT;  Laterality: N/A;   LITHOTRIPSY  01/21/2019   PROSTATE BIOPSY      FAMILY HISTORY:  Family History  Problem Relation Age of Onset   Heart attack Mother    Heart attack Brother    Hypertension Neg Hx    Heart disease Neg Hx    Diabetes Neg Hx    Cancer Neg Hx     SOCIAL HISTORY:  Social History   Socioeconomic History   Marital status: Married    Spouse name: Not on file   Number of children: Not on file   Years of education: Not on file   Highest education level: Not on file  Occupational History   Not on file  Tobacco Use   Smoking status: Former    Current packs/day: 0.00    Types: Cigarettes    Quit date: 1999    Years since quitting: 25.8   Smokeless tobacco: Never  Substance and Sexual Activity   Alcohol use: Not Currently   Drug use: Never   Sexual activity: Not on file  Other Topics Concern   Not on file  Social History Narrative   Not on file   Social Determinants of Health   Financial Resource Strain: Not on file  Food Insecurity: Low Risk  (08/19/2023)   Received from [facility]   Hunger Vital Sign    Worried About Running Out of Food in the Last Year: Never true    Ran Out of Food in the Last Year: Never true  Transportation Needs: No Transportation Needs (08/19/2023)   Received from [employer]    In the past 12 months, has lack of reliable transportation kept you from medical appointments, meetings, work or from getting things needed for daily living? : No  Physical Activity: Not on file  Stress: Not on file  Social Connections: Not on file  Intimate Partner Violence: Not At Risk (07/24/2023)   Humiliation, Afraid, Rape, and Kick questionnaire    Fear of Current or Ex-Partner: No    Emotionally Abused: No    Physically Abused: No    Sexually Abused: No    ALLERGIES: Ciprofloxacin and Statins  MEDICATIONS:  Current Outpatient Medications  Medication Sig  Dispense Refill   acetaminophen (TYLENOL) 500 MG tablet Take 500 mg by mouth every 6 (six) hours as needed for headache or pain.     albuterol (VENTOLIN HFA) 108 (90 Base) MCG/ACT inhaler Inhale 2 puffs into the lungs every 6 (six) hours as needed for wheezing or shortness of breath.     ALPRAZolam (  XANAX) 0.5 MG tablet Take 0.5 mg by mouth at bedtime as needed for anxiety.     ascorbic acid (VITAMIN C) 500 MG tablet Take 500 mg by mouth daily.     budesonide-formoterol (SYMBICORT) 160-4.5 MCG/ACT inhaler Inhale 2 puffs into the lungs 2 (two) times daily.     clopidogrel (PLAVIX) 75 MG tablet Take 75 mg by mouth daily.     finasteride (PROSCAR) 5 MG tablet Take 5 mg by mouth daily.     fluticasone (FLONASE) 50 MCG/ACT nasal spray Place 1 spray into both nostrils daily.     furosemide (LASIX) 40 MG tablet Take 40 mg by mouth daily. (Patient not taking: Reported on 07/24/2023)     ketorolac (ACULAR) 0.5 % ophthalmic solution Place 1 drop into both eyes once.     MAGNESIUM PO Take 1 tablet by mouth daily. (Patient not taking: Reported on 07/24/2023)     metoprolol succinate (TOPROL-XL) 50 MG 24 hr tablet Take 50 mg by mouth daily. Take with or immediately following a meal.     potassium chloride (MICRO-K) 10 MEQ CR capsule Take 10 mEq by mouth daily.     predniSONE (DELTASONE) 20 MG tablet Take 1 tablet by mouth daily.     rosuvastatin (CRESTOR) 10 MG tablet Take 10 mg by mouth daily.     sertraline (ZOLOFT) 100 MG tablet Take 150 mg by mouth daily.     tadalafil (CIALIS) 5 MG tablet Take 5 mg by mouth at bedtime.     tamsulosin (FLOMAX) 0.4 MG CAPS capsule Take 0.4 mg by mouth at bedtime.     Tiotropium Bromide-Olodaterol 2.5-2.5 MCG/ACT AERS Inhale 2.5 mcg into the lungs daily.     traMADol (ULTRAM) 50 MG tablet Take 50-100 mg by mouth every 6 (six) hours as needed for severe pain.     vitamin B-12 (CYANOCOBALAMIN) 50 MCG tablet Take 50 mcg by mouth daily.     No current facility-administered  medications for this visit.    REVIEW OF SYSTEMS:  On review of systems, the patient reports that he is doing well overall. He denies any chest pain, shortness of breath, cough, fevers, chills, night sweats, unintended weight changes. He denies any bowel disturbances, and denies abdominal pain, nausea or vomiting. He denies any new musculoskeletal or joint aches or pains. His IPSS was 1, indicating very mild urinary symptoms with nocturia x 1 only. His SHIM was 1, indicating he has severe erectile dysfunction. A complete review of systems is obtained and is otherwise negative.     PHYSICAL EXAM:  Wt Readings from Last 3 Encounters:  07/24/23 205 lb (93 kg)  07/01/22 207 lb (93.9 kg)  06/06/22 207 lb 6.4 oz (94.1 kg)   Temp Readings from Last 3 Encounters:  11/20/21 98 F (36.7 C)  11/19/21 98.3 F (36.8 C) (Oral)  08/14/21 98.1 F (36.7 C)   BP Readings from Last 3 Encounters:  06/06/22 117/76  11/20/21 (!) 146/93  11/19/21 119/74   Pulse Readings from Last 3 Encounters:  06/06/22 88  11/20/21 60  11/19/21 70    /10  In general this is a well appearing Caucasian male in no acute distress. He's alert and oriented x4 and appropriate throughout the examination. Cardiopulmonary assessment is negative for acute distress, and he exhibits normal effort.     KPS = 100  100 - Normal; no complaints; no evidence of disease. 90   - Able to carry on normal activity; minor signs or symptoms  of disease. 80   - Normal activity with effort; some signs or symptoms of disease. 54   - Cares for self; unable to carry on normal activity or to do active work. 60   - Requires occasional assistance, but is able to care for most of his personal needs. 50   - Requires considerable assistance and frequent medical care. 40   - Disabled; requires special care and assistance. 30   - Severely disabled; hospital admission is indicated although death not imminent. 20   - Very sick; hospital admission  necessary; active supportive treatment necessary. 10   - Moribund; fatal processes progressing rapidly. 0     - Dead  Karnofsky DA, Abelmann WH, Craver LS and Burchenal [facility] [id number]) The use of the nitrogen mustards in the palliative treatment of carcinoma: with particular reference to bronchogenic carcinoma Cancer 1 634-56  LABORATORY DATA:  Lab Results  Component Value Date   WBC 11.2 (H) 11/19/2021   HGB 16.4 11/19/2021   HCT 48.3 11/19/2021   MCV 89.0 11/19/2021   PLT 327 11/19/2021   Lab Results  Component Value Date   NA 137 11/19/2021   K 3.5 11/19/2021   CL 100 11/19/2021   CO2 27 11/19/2021   No results found for: "ALT", "AST", "GGT", "ALKPHOS", "BILITOT"   RADIOGRAPHY: No results found.    IMPRESSION/PLAN: 1. 72 y.o. gentleman with Stage T1c adenocarcinoma of the prostate with Gleason score of 3+4, and PSA of 4.8.  The patient has elected to proceed with seed implant for treatment of his disease. We reviewed the risks, benefits, short and long-term effects associated with brachytherapy.  He appears to have a good understanding of his disease and our treatment recommendations which are of curative intent.  He was encouraged to ask questions that were answered to his stated satisfaction. He has freely signed written consent to proceed today in the office and a copy of this document will be placed in his medical record. His procedure is tentatively scheduled for 09/24/23 in collaboration with Dr. Saddie Benders and we will see him back for his post-procedure visit approximately 3 weeks thereafter. We look forward to continuing to participate in his care. He knows that he is welcome to call with any questions or concerns at any time in the interim.  I personally spent 30 minutes in this encounter including chart review, reviewing radiological studies, meeting face-to-face with the patient, entering orders and completing documentation.    Marguarite Arbour, MMS, PA-C [facility] Radiation Oncology Physician Assistant Direct Dial: [phone]  Fax: [phone]

## 2023-09-02 NOTE — Progress Notes (Signed)
Radiation Oncology         [phone]) [phone] ________________________________  Name: Matthew Sharp MRN: [id number]  Date: 09/03/2023  DOB: 1951-02-12  SIMULATION AND TREATMENT PLANNING NOTE PUBIC ARCH STUDY  WU:[id number], Evlyn Courier., MD  Debroah Baller, MD  DIAGNOSIS: 72 y.o. gentleman with Stage T1c adenocarcinoma of the prostate with Gleason score of 3+4, and PSA of 4.8.   Oncology History  Malignant neoplasm of prostate (HCC)  06/16/2023 Cancer Staging   Staging form: Prostate, AJCC 8th Edition - Clinical stage from 06/16/2023: Stage IIB (cT1c, cN0, cM0, PSA: 4.8, Grade Group: 2) - Signed by Marcello Fennel, PA-C on 07/24/2023 Histopathologic type: Adenocarcinoma, NOS Stage prefix: Initial diagnosis Prostate specific antigen (PSA) range: Less than 10 Gleason primary pattern: 3 Gleason secondary pattern: 4 Gleason score: 7 Histologic grading system: 5 grade system Number of biopsy cores examined: 16 Number of biopsy cores positive: 5 Location of positive needle core biopsies: Both sides   07/24/2023 Initial Diagnosis   Malignant neoplasm of prostate (HCC)       ICD-10-CM   1. Malignant neoplasm of prostate (HCC)  C61       COMPLEX SIMULATION:  The patient presented today for evaluation for possible prostate seed implant. He was brought to the radiation planning suite and placed supine on the CT couch. A 3-dimensional image study set was obtained in upload to the planning computer. There, on each axial slice, I contoured the prostate gland. Then, using three-dimensional radiation planning tools I reconstructed the prostate in view of the structures from the transperineal needle pathway to assess for possible pubic arch interference. In doing so, I did not appreciate any pubic arch interference. Also, the patient's prostate volume was estimated based on the drawn structure. The volume was 43 cc.  Given the pubic arch appearance and prostate volume, patient remains a good candidate to proceed  with prostate seed implant. Today, he freely provided informed written consent to proceed.    PLAN: The patient will undergo prostate seed implant.   ________________________________  Artist Pais. Kathrynn Running, M.D.

## 2023-09-03 ENCOUNTER — Encounter: Payer: Self-pay | Admitting: Urology

## 2023-09-03 ENCOUNTER — Ambulatory Visit
Admission: RE | Admit: 2023-09-03 | Discharge: 2023-09-03 | Disposition: A | Discharge status: Home / Self Care | Payer: PPO | Source: Ambulatory Visit | Attending: Urology | Admitting: Urology

## 2023-09-03 ENCOUNTER — Encounter (HOSPITAL_COMMUNITY)
Admission: RE | Admit: 2023-09-03 | Discharge: 2023-09-03 | Disposition: A | Discharge status: Home / Self Care | Payer: PPO | Source: Ambulatory Visit | Attending: Internal Medicine | Admitting: Internal Medicine

## 2023-09-03 ENCOUNTER — Ambulatory Visit
Admission: RE | Admit: 2023-09-03 | Discharge: 2023-09-03 | Disposition: A | Discharge status: Home / Self Care | Payer: PPO | Source: Ambulatory Visit | Attending: Radiation Oncology | Admitting: Radiation Oncology

## 2023-09-03 DIAGNOSIS — C61 Malignant neoplasm of prostate: Secondary | ICD-10-CM

## 2023-09-03 DIAGNOSIS — Z01810 Encounter for preprocedural cardiovascular examination: Secondary | ICD-10-CM | POA: Insufficient documentation

## 2023-09-03 DIAGNOSIS — Z01818 Encounter for other preprocedural examination: Secondary | ICD-10-CM

## 2023-09-03 NOTE — Progress Notes (Signed)
Pre-seed nursing interview for a diagnosis of Stage T1c adenocarcinoma of the prostate with Gleason score of 3+4, and PSA of 4.8.  Patient identity verified x2.   Patient reports rare episodes of dysuria 3/10. Patient denies all other related issues at this time.  Meaningful use complete.  Urinary Management medication(s)- Tamsulosin Urology appointment date- Pending, with Dr. Betsy Coder at [facility]  There were no vitals taken for this visit.  This concludes the interaction.  Ruel Favors, LPN

## 2023-09-04 NOTE — Plan of Care (Signed)
[facility] Tonsillectomy/Adenoidectomy, Postoperative PEDS care plan entered in error.

## 2023-09-18 NOTE — H&P (Unsigned)
NAME: Matthew Sharp, Matthew Sharp MEDICAL RECORD NO: [id number] ACCOUNT NO: [id number] DATE OF BIRTH: June 05, 1951 FACILITY: [facility] LOCATION: [facility] PHYSICIAN: Debroah Baller, MD  History and Physical   DATE OF ADMISSION: 08/03/2023  This is for procedure on September 24, 2023.  HISTORY OF PRESENT ILLNESS:  The patient is a 72 year old white male with a recent diagnosis of prostate cancer.  His PSA was initially elevated to 4.7.  Initial biopsy in 05/2022 was negative.  His PSA remained elevated and a prostate MRI was done in  04/2023.  This showed a PI-RADS 4 lesion on the left posterolateral peripheral zone.  The patient underwent MRI fusion biopsy of the prostate on 06/16/2023.  Prostate volume measured 45 mL.  Out of the cores, 5 were positive with maximum Gleason score 3  + 4 and this was seen in three of the four cores from the region of interest as well as the right apex and right lateral apex.  He underwent staging CT scan in 06/2023, which did not show any evidence of metastatic disease.  The patient has reviewed the  findings and he is interested in definitive therapy with radioactive seed implantation.    His past history is significant for arterial sclerotic heart disease, BPH, history of emphysema and COPD, he does have degenerative lumbar disk, hypertension, gastroesophageal reflux disease.    PAST SURGERIES:  Include colonoscopy and previous lithotripsy of renal stones.    FAMILY HISTORY:  Is significant for coronary artery disease and hypertension.  He is married.  He is a former smoker.  He does not use alcohol at this time.    He is on several medications including Flomax, finasteride, and daily tadalafil.  He is on Plavix and has held this.    His other medications have been listed in the chart.    REVIEW OF SYSTEMS:  Negative for any chest pain, shortness of breath, cough, fever, chills, or night sweats.  At this time, he has mild urinary symptoms, nocturia x1.  ALLERGIES:  No known  drug allergies.  MEDICATIONS:  Flomax, finasteride, daily tadalafil, and Plavix.  PAST MEDICAL HISTORY:  Arterial sclerotic heart disease, BPH, history of emphysema and COPD, degenerative lumbar disk, hypertension, and gastroesophageal reflux disease.    PAST SURGICAL HISTORY:  Colonoscopy and previous lithotripsy of renal stones.  SOCIAL HISTORY:  He is married.  He is a former smoker.  He does not use alcohol at this time.  FAMILY HISTORY:  Significant for coronary artery disease and hypertension.  REVIEW OF SYSTEMS:  Negative for any chest pain, shortness of breath, cough, fever, chills, or night sweats.  At this time, he has mild urinary symptoms, nocturia x1.  PHYSICAL EXAMINATION: GENERAL:  Well developed, well nourished male, in no acute distress.  HEENT:  The head is normocephalic and atraumatic.  The eyes show pupils are round and reactive to light.  ENT does not show any mucosal irritation. NECK:  Supple without masses. BACK:  Without CVA tenderness.   LUNGS:  Clear to auscultation. CARDIOVASCULAR:  Heart sounds are regular rate and rhythm. ABDOMEN:  Obese, nontender. PELVIC:  GU:  The penis is without lesions.  Both testes are descended.  Rectal exam reveals adequate sphincter tone.  Prostate exam is 40 g prostate, smooth, nontender.   EXTREMITIES:  Without clubbing, cyanosis, or edema. NEUROLOGIC:  He is intact and nonfocal.  LABORATORY DATA:   Pending.  ASSESSMENT:  Stage T1c prostate cancer.  PLAN:  The patient has reviewed his pathology findings  as well as radiologic imaging.  He has reviewed his options for therapy and wishes to proceed with radioactive seed implantation.  The plan will be to perform definitive therapy with radioactive seed  implantation.  The risks and benefits have been reviewed.  Questions answered.  The patient verbalizes understanding and wishes to proceed.      SUJ D: 09/18/2023 6:14:49 pm T: 09/18/2023 11:40:00 pm  JOB: [id number]/ [id number]

## 2023-09-21 NOTE — Progress Notes (Addendum)
Spoke with Matthew Sharp at Dr Saddie Benders office, patient is using oxygen 2 liters at hs and cannot be done at [facility], Matthew Sharp to make Dr Saddie Benders aware. Spoke with dr Greggory Stallion rose mda, pt can be done at [facility]

## 2023-09-22 ENCOUNTER — Encounter (HOSPITAL_COMMUNITY): Payer: Self-pay | Admitting: Urology

## 2023-09-22 ENCOUNTER — Encounter (HOSPITAL_COMMUNITY): Payer: Self-pay

## 2023-09-22 ENCOUNTER — Other Ambulatory Visit: Payer: Self-pay

## 2023-09-22 NOTE — Anesthesia Preprocedure Evaluation (Signed)
Anesthesia Evaluation  Patient identified by MRN, date of birth, ID band Patient awake    Reviewed: Allergy & Precautions, H&P , NPO status , Patient's Chart, lab work & pertinent test results  Airway Mallampati: II  TM Distance: >3 FB Neck ROM: Full    Dental  (+) Upper Dentures, Lower Dentures   Pulmonary neg pulmonary ROS, sleep apnea ([place]) , COPD,  COPD inhaler and oxygen dependent, former smoker   breath sounds clear to auscultation + decreased breath sounds      Cardiovascular hypertension, Normal cardiovascular exam(-) dysrhythmias  Rhythm:Regular Rate:Normal  06/2022 Stress:  The study is normal. The study is low risk.   No ST deviation was noted.   Left ventricular function is normal. Nuclear stress EF: 61 %. The left ventricular ejection fraction is normal (55-65%)  06/2022 ECHO: EF 55 to 60%.  1. The LV has normal function, no regional wall motion abnormalities. Grade II diastolic dysfunction (pseudonormalization).   2. RVF is normal. The right ventricular size is normal.   3. The mitral valve is normal in structure. Trivial mitral valve regurgitation. No evidence of mitral stenosis.   4. The aortic valve is normal in structure. Aortic valve regurgitation is not visualized. No aortic stenosis is present.     Neuro/Psych   Anxiety Depression    Chronic back pain CVA, No Residual Symptoms  negative psych ROS   GI/Hepatic negative GI ROS, Neg liver ROS,,,  Endo/Other  negative endocrine ROS    Renal/GU negative Renal ROS   Prostate cancer negative genitourinary   Musculoskeletal negative musculoskeletal ROS (+) Arthritis ,    Abdominal   Peds negative pediatric ROS (+)  Hematology negative hematology ROS (+) plavix   Anesthesia Other Findings   Reproductive/Obstetrics negative OB ROS                              Anesthesia Physical Anesthesia Plan  ASA: 3  Anesthesia  Plan: General   Post-op Pain Management: Tylenol PO (pre-op)*   Induction: Intravenous  PONV Risk Score and Plan: 2 and Ondansetron, Dexamethasone and Treatment may vary due to age or medical condition  Airway Management Planned: Oral ETT  Additional Equipment:   Intra-op Plan:   Post-operative Plan: Extubation in OR  Informed Consent: I have reviewed the patients History and Physical, chart, labs and discussed the procedure including the risks, benefits and alternatives for the proposed anesthesia with the patient or authorized representative who has indicated his/her understanding and acceptance.     Dental advisory given  Plan Discussed with: CRNA and Surgeon  Anesthesia Plan Comments: (See PAT note from 10/17 by Sherlie Ban PA-C )         Anesthesia Quick Evaluation

## 2023-09-22 NOTE — Progress Notes (Addendum)
DISCUSSION: Matthew Sharp is a 72 yo male who is being evaluated prior to RADIOACTIVE SEED IMPLANT/BRACHYTHERAPY IMPLANT on 09/24/23 with Dr. Saddie Benders. PMH of  former smoking, HTN, CAD, COPD, ILD, nocturnal hypoxemia on O2 at night, OSA (s/p inspire device in 2023), hx of CVA, prostate cancer, prediabetes, depression.  Patient is followed by Pulmonology for hx of COPD, ILD, OSA, and nocturnal hypoxemia. He was last seen on 10/2. He was advised to start using 2L of O2 at night. Maintained on inhalers for COPD. Per his pulmonologist: "He has mixed obstructive and restrictive disease but fortunately the most recent CT scan of his chest did not show evidence of progressive diffuse parenchymal lung disease from his prior COVID lung injury. I think for now we just keep using short acting as needed bronchodilators and encourage regular exercise. However, if he develops worsening dyspnea then we can reassess his pulmonary parenchyma with PFTs and a high-res CT."  Advised f/u in 1 year.  Patient was previously evaluated by Cardiology for DOE. Last seen on 06/06/2022. Thought to be multifactorial but he underwent a stress test which was low risk on 07/01/2022. Had an echo which showed normal EF and grade II DD.  Patient follows with PCP for chronic medical conditions. Maintained on Plavix for hx of CVA vs TIA "15 years ago". Had pre-op evaluation on 05/14/23. All issues appear to be stable.  Plavix. Last dose on 09-20-23   VS:     09/22/2023   10:20 AM 07/24/2023   12:36 PM 07/01/2022    7:41 AM  Vitals with BMI  Height 5\' 10"   5\' 8"   Weight 205 lbs 205 lbs 207 lbs  BMI 29.41  31.48     PROVIDERS: Gordan Payment., MD Pulmonology: Max Fickle, MD  LABS: Obtain DOS (all labs ordered are listed, but only abnormal results are displayed)  Labs Reviewed - No data to display   IMAGES:  CT Chest 01/13/2022:  IMPRESSION:  1. Spectrum of pulmonary parenchymal findings compatible with  fibrotic interstitial lung  disease with ground-glass predominance,  no clear apicobasilar gradient and no frank honeycombing, most  compatible with fibrotic NSIP pattern due to prior COVID-19  infection. The ground-glass opacities and septal thickening are  mildly decreased since 03/19/2020 chest CT. No interval progressive  disease. Findings are suggestive of an alternative diagnosis (not  UIP) per consensus guidelines: Diagnosis of Idiopathic Pulmonary  Fibrosis: An Official ATS/ERS/JRS/ALAT Clinical Practice Guideline.  Am Rosezetta Schlatter Crit Care Med Vol 198, Iss 5, [phone], Jul 18 2017.  2. Three-vessel coronary atherosclerosis.  3. Aortic Atherosclerosis (ICD10-I70.0) and Emphysema (ICD10-J43.9).    EKG 09/03/2023  Normal sinus rhythm, rate 68 Left anterior fascicular block Left ventricular hypertrophy Cannot rule out Anterior infarct , age undetermined   CV:  Stress test 07/01/2022:    The study is normal. The study is low risk.   No ST deviation was noted.   Left ventricular function is normal. Nuclear stress EF: 61 %. The left ventricular ejection fraction is normal (55-65%). End diastolic cavity size is normal.   Prior study not available for comparison.  Echo 07/01/2022:  IMPRESSIONS     1. Left ventricular ejection fraction, by estimation, is 55 to 60%. The  left ventricle has normal function. The left ventricle has no regional  wall motion abnormalities. Left ventricular diastolic parameters are  consistent with Grade II diastolic  dysfunction (pseudonormalization).   2. Right ventricular systolic function is normal. The right ventricular  size is  normal.   3. The mitral valve is normal in structure. Trivial mitral valve  regurgitation. No evidence of mitral stenosis.   4. The aortic valve is normal in structure. Aortic valve regurgitation is  not visualized. No aortic stenosis is present.   5. The inferior vena cava is normal in size with greater than 50%  respiratory variability,  suggesting right atrial pressure of 3 mmHg.   Past Medical History:  Diagnosis Date   Abdominal aortic atherosclerosis (HCC)    Achilles tendinitis of left lower extremity    Anxiety    Arteriosclerotic heart disease    Barrett's esophagus    BPH (benign prostatic hyperplasia)    Centrilobular emphysema (HCC)    Chronic back pain    Class 1 obesity due to excess calories with serious comorbidity and body mass index (BMI) of 32.0 to 32.9 in adult    COPD (chronic obstructive pulmonary disease) (HCC)    Coronary artery disease    Degenerative lumbar disc    Dyspnea on exertion    Elevated PSA    Enthesopathy of ankle and tarsus    Essential hypertension    GERD (gastroesophageal reflux disease)    Hearing loss of right ear    History of COVID-19    History of kidney stones    Hypogonadism in male    Insomnia    Interstitial lung disease (HCC)    secondary to covid   Irritability and anger    LAFB (left anterior fascicular block)    Major depressive disorder, recurrent (HCC) 03/18/2016   Malaise and fatigue    Mild episode of recurrent major depressive disorder (HCC)    Mixed hyperlipidemia    Myalgia due to statin    On home oxygen therapy    At night   OSA (obstructive sleep apnea)    Osteoarthritis    Pneumonia    with COVID   Prediabetes    Prostate cancer (HCC)    Retrocalcaneal bursitis, left    RLS (restless legs syndrome)    Stroke (HCC)    on Plavix, no deficits    Past Surgical History:  Procedure Laterality Date   COLONOSCOPY     DRUG INDUCED ENDOSCOPY N/A 08/14/2021   Procedure: DRUG INDUCED SLEEP ENDOSCOPY;  Surgeon: Christia Reading, MD;  Location:  SURGERY CENTER;  Service: ENT;  Laterality: N/A;   IMPLANTATION OF HYPOGLOSSAL NERVE STIMULATOR N/A 11/20/2021   Procedure: IMPLANTATION OF HYPOGLOSSAL NERVE STIMULATOR;  Surgeon: Christia Reading, MD;  Location: [facility] OR;  Service: ENT;  Laterality: N/A;   LITHOTRIPSY  01/21/2019   PROSTATE BIOPSY       MEDICATIONS:  acetaminophen (TYLENOL) 500 MG tablet   albuterol (VENTOLIN HFA) 108 (90 Base) MCG/ACT inhaler   ALPRAZolam (XANAX) 0.5 MG tablet   ascorbic acid (VITAMIN C) 500 MG tablet   budesonide-formoterol (SYMBICORT) 160-4.5 MCG/ACT inhaler   clopidogrel (PLAVIX) 75 MG tablet   COMBIVENT RESPIMAT 20-100 MCG/ACT AERS respimat   fluticasone (FLONASE) 50 MCG/ACT nasal spray   furosemide (LASIX) 40 MG tablet   ketorolac (ACULAR) 0.5 % ophthalmic solution   MAGNESIUM PO   metoprolol succinate (TOPROL-XL) 50 MG 24 hr tablet   potassium chloride (MICRO-K) 10 MEQ CR capsule   predniSONE (DELTASONE) 20 MG tablet   rosuvastatin (CRESTOR) 10 MG tablet   sertraline (ZOLOFT) 100 MG tablet   tadalafil (CIALIS) 5 MG tablet   tamsulosin (FLOMAX) 0.4 MG CAPS capsule   Tiotropium Bromide-Olodaterol 2.5-2.5 MCG/ACT AERS  traMADol (ULTRAM) 50 MG tablet   vitamin B-12 (CYANOCOBALAMIN) 50 MCG tablet   No current facility-administered medications for this encounter.   Marcille Blanco [facility] Surgical Short Stay/Anesthesiology [facility] Phone [phone] 09/22/2023 11:09 AM

## 2023-09-22 NOTE — Progress Notes (Signed)
COVID Vaccine Completed:  Date of COVID positive in last 90 days:  No  PCP - Feliciana Rossetti, MD Cardiologist - Belva Crome, MD Pulmonologist - Max Fickle, MD  Chest x-ray - N/A EKG - 09-03-23 Stress Test - 07-01-22 Epic ECHO - 07-01-22 Epic Cardiac Cath - N/A Pacemaker/ICD device last checked: Spinal Cord Stimulator:    Bowel Prep - N/A  Sleep Study - Yes, +sleep apnea CPAP - Inspire implant, patient will bring remote day of procedure.    Prediabetes Fasting Blood Sugar -  Checks Blood Sugar - does not check   Last dose of GLP1 agonist-  N/A GLP1 instructions:  N/A   Last dose of SGLT-2 inhibitors-  N/A SGLT-2 instructions: N/A   Blood Thinner Instructions:  Plavix.  Last dose on 09-20-23 Aspirin Instructions: Last Dose:  Activity level:  Can go up a flight of stairs and perform activities of daily living without stopping and without symptoms of chest pain.  Patient has  shortness of breath with exertion.  Patient is exercises as tolerated.    Anesthesia review:  CAD, COPD, emphysema, ILD,  O2 at 2L at night, hx of stroke (no deficits), HTN, OSA, prediabetes  Patient denies shortness of breath, fever, cough and chest pain at PAT appointment (completed over the phone)  Patient verbalized understanding of instructions that were given to them at the PAT appointment. Patient was also instructed that they will need to review over the PAT instructions again at home before surgery.

## 2023-09-23 ENCOUNTER — Telehealth: Payer: Self-pay | Admitting: *Deleted

## 2023-09-23 NOTE — Telephone Encounter (Signed)
CALLED PATIENT TO REMIND OF PROCEDURE FOR 09-24-23, SPOKE WITH PATIENT AND HE IS AWARE OF THIS PROCEDURE

## 2023-09-24 ENCOUNTER — Ambulatory Visit (HOSPITAL_COMMUNITY): Payer: PPO | Admitting: Medical

## 2023-09-24 ENCOUNTER — Encounter (HOSPITAL_COMMUNITY): Payer: Self-pay | Admitting: Urology

## 2023-09-24 ENCOUNTER — Encounter (HOSPITAL_COMMUNITY)
Admission: RE | Disposition: A | Discharge status: Home / Self Care | Payer: Self-pay | Source: Home / Self Care | Attending: Urology

## 2023-09-24 ENCOUNTER — Ambulatory Visit (HOSPITAL_BASED_OUTPATIENT_CLINIC_OR_DEPARTMENT_OTHER): Payer: Self-pay | Admitting: Medical

## 2023-09-24 ENCOUNTER — Ambulatory Visit (HOSPITAL_COMMUNITY)
Admission: RE | Admit: 2023-09-24 | Discharge: 2023-09-24 | Disposition: A | Discharge status: Home / Self Care | Payer: PPO | Attending: Urology | Admitting: Urology

## 2023-09-24 ENCOUNTER — Ambulatory Visit (HOSPITAL_COMMUNITY): Payer: PPO

## 2023-09-24 DIAGNOSIS — Z9981 Dependence on supplemental oxygen: Secondary | ICD-10-CM | POA: Diagnosis not present

## 2023-09-24 DIAGNOSIS — J439 Emphysema, unspecified: Secondary | ICD-10-CM | POA: Diagnosis not present

## 2023-09-24 DIAGNOSIS — Z87891 Personal history of nicotine dependence: Secondary | ICD-10-CM | POA: Diagnosis not present

## 2023-09-24 DIAGNOSIS — J449 Chronic obstructive pulmonary disease, unspecified: Secondary | ICD-10-CM | POA: Diagnosis not present

## 2023-09-24 DIAGNOSIS — Z7951 Long term (current) use of inhaled steroids: Secondary | ICD-10-CM | POA: Insufficient documentation

## 2023-09-24 DIAGNOSIS — M47816 Spondylosis without myelopathy or radiculopathy, lumbar region: Secondary | ICD-10-CM | POA: Insufficient documentation

## 2023-09-24 DIAGNOSIS — Z7902 Long term (current) use of antithrombotics/antiplatelets: Secondary | ICD-10-CM | POA: Insufficient documentation

## 2023-09-24 DIAGNOSIS — Z79899 Other long term (current) drug therapy: Secondary | ICD-10-CM | POA: Insufficient documentation

## 2023-09-24 DIAGNOSIS — Z01818 Encounter for other preprocedural examination: Secondary | ICD-10-CM

## 2023-09-24 DIAGNOSIS — C61 Malignant neoplasm of prostate: Secondary | ICD-10-CM | POA: Diagnosis present

## 2023-09-24 DIAGNOSIS — I10 Essential (primary) hypertension: Secondary | ICD-10-CM | POA: Insufficient documentation

## 2023-09-24 DIAGNOSIS — G8929 Other chronic pain: Secondary | ICD-10-CM | POA: Insufficient documentation

## 2023-09-24 DIAGNOSIS — Z8673 Personal history of transient ischemic attack (TIA), and cerebral infarction without residual deficits: Secondary | ICD-10-CM | POA: Insufficient documentation

## 2023-09-24 DIAGNOSIS — F418 Other specified anxiety disorders: Secondary | ICD-10-CM | POA: Diagnosis not present

## 2023-09-24 DIAGNOSIS — K219 Gastro-esophageal reflux disease without esophagitis: Secondary | ICD-10-CM | POA: Insufficient documentation

## 2023-09-24 HISTORY — DX: Dependence on supplemental oxygen: Z99.81

## 2023-09-24 HISTORY — PX: RADIOACTIVE SEED IMPLANT: SHX5150

## 2023-09-24 HISTORY — DX: Malignant neoplasm of prostate: C61

## 2023-09-24 LAB — BASIC METABOLIC PANEL
Anion gap: 13 (ref 5–15)
BUN: 13 mg/dL (ref 8–23)
CO2: 26 mmol/L (ref 22–32)
Calcium: 9.2 mg/dL (ref 8.9–10.3)
Chloride: 104 mmol/L (ref 98–111)
Creatinine, Ser: 1.05 mg/dL (ref 0.61–1.24)
GFR, Estimated: >60 mL/min (ref >60)
Glucose, Bld: 97 mg/dL (ref 70–99)
Potassium: 3.9 mmol/L (ref 3.5–5.1)
Sodium: 143 mmol/L (ref 135–145)

## 2023-09-24 LAB — CBC
HCT: 41.9 % (ref 39.0–52.0)
Hemoglobin: 14.2 g/dL (ref 13.0–17.0)
MCH: 29.8 pg (ref 26.0–34.0)
MCHC: 33.9 g/dL (ref 30.0–36.0)
MCV: 88 fL (ref 80.0–100.0)
Platelets: 282 10*3/uL (ref 150–400)
RBC: 4.76 MIL/uL (ref 4.22–5.81)
RDW: 13.5 % (ref 11.5–15.5)
WBC: 10.5 10*3/uL (ref 4.0–10.5)
nRBC: 0 % (ref 0.0–0.2)

## 2023-09-24 SURGERY — INSERTION, RADIATION SOURCE, PROSTATE
Anesthesia: General | Site: Prostate

## 2023-09-24 MED ORDER — PHENYLEPHRINE 80 MCG/ML (10ML) SYRINGE FOR IV PUSH (FOR BLOOD PRESSURE SUPPORT)
PREFILLED_SYRINGE | INTRAVENOUS | Status: AC
  Filled 2023-09-24: qty 10

## 2023-09-24 MED ORDER — PROPOFOL 10 MG/ML IV BOLUS
INTRAVENOUS | Status: AC
  Filled 2023-09-24: qty 20

## 2023-09-24 MED ORDER — IOHEXOL 300 MG/ML  SOLN
INTRAMUSCULAR | Status: DC | PRN
  Administered 2023-09-24: 5 mL

## 2023-09-24 MED ORDER — EPHEDRINE SULFATE (PRESSORS) 50 MG/ML IJ SOLN
INTRAMUSCULAR | Status: DC | PRN
  Administered 2023-09-24 (×2): 5 mg via INTRAVENOUS

## 2023-09-24 MED ORDER — ONDANSETRON HCL 4 MG/2ML IJ SOLN
INTRAMUSCULAR | Status: AC
  Filled 2023-09-24: qty 2

## 2023-09-24 MED ORDER — ONDANSETRON HCL 4 MG/2ML IJ SOLN
INTRAMUSCULAR | Status: DC | PRN
  Administered 2023-09-24: 4 mg via INTRAVENOUS

## 2023-09-24 MED ORDER — OXYCODONE HCL 5 MG/5ML PO SOLN: 5.0000 mg | Freq: Once | ORAL | Status: DC | PRN

## 2023-09-24 MED ORDER — SUGAMMADEX SODIUM 200 MG/2ML IV SOLN
INTRAVENOUS | Status: DC | PRN
  Administered 2023-09-24: 200 mg via INTRAVENOUS

## 2023-09-24 MED ORDER — FENTANYL CITRATE (PF) 100 MCG/2ML IJ SOLN
INTRAMUSCULAR | Status: AC
  Filled 2023-09-24: qty 2

## 2023-09-24 MED ORDER — PROPOFOL 10 MG/ML IV BOLUS
INTRAVENOUS | Status: DC | PRN
  Administered 2023-09-24: 150 mg via INTRAVENOUS
  Administered 2023-09-24: 50 mg via INTRAVENOUS

## 2023-09-24 MED ORDER — ROCURONIUM BROMIDE 10 MG/ML (PF) SYRINGE
PREFILLED_SYRINGE | INTRAVENOUS | Status: AC
  Filled 2023-09-24: qty 10

## 2023-09-24 MED ORDER — LIDOCAINE HCL (PF) 2 % IJ SOLN
INTRAMUSCULAR | Status: AC
  Filled 2023-09-24: qty 5

## 2023-09-24 MED ORDER — LIDOCAINE 2% (20 MG/ML) 5 ML SYRINGE
INTRAMUSCULAR | Status: DC | PRN
  Administered 2023-09-24: 50 mg via INTRAVENOUS

## 2023-09-24 MED ORDER — DEXAMETHASONE SODIUM PHOSPHATE 10 MG/ML IJ SOLN
INTRAMUSCULAR | Status: AC
  Filled 2023-09-24: qty 1

## 2023-09-24 MED ORDER — DEXAMETHASONE SODIUM PHOSPHATE 10 MG/ML IJ SOLN
INTRAMUSCULAR | Status: DC | PRN
  Administered 2023-09-24: 10 mg via INTRAVENOUS

## 2023-09-24 MED ORDER — SODIUM CHLORIDE (PF) 0.9 % IJ SOLN
INTRAMUSCULAR | Status: AC
  Filled 2023-09-24: qty 10

## 2023-09-24 MED ORDER — PHENYLEPHRINE HCL (PRESSORS) 10 MG/ML IV SOLN
INTRAVENOUS | Status: DC | PRN
  Administered 2023-09-24 (×2): 80 ug via INTRAVENOUS

## 2023-09-24 MED ORDER — FENTANYL CITRATE (PF) 100 MCG/2ML IJ SOLN
INTRAMUSCULAR | Status: DC | PRN
  Administered 2023-09-24 (×2): 50 ug via INTRAVENOUS

## 2023-09-24 MED ORDER — ROCURONIUM BROMIDE 10 MG/ML (PF) SYRINGE
PREFILLED_SYRINGE | INTRAVENOUS | Status: DC | PRN
  Administered 2023-09-24: 60 mg via INTRAVENOUS

## 2023-09-24 MED ORDER — ACETAMINOPHEN 500 MG PO TABS
1000.0000 mg | ORAL_TABLET | Freq: Once | ORAL | Status: AC
  Administered 2023-09-24: 1000 mg via ORAL
  Filled 2023-09-24: qty 2

## 2023-09-24 MED ORDER — FENTANYL CITRATE PF 50 MCG/ML IJ SOSY: 25.0000 ug | PREFILLED_SYRINGE | INTRAMUSCULAR | Status: DC | PRN

## 2023-09-24 MED ORDER — EPHEDRINE 5 MG/ML INJ
INTRAVENOUS | Status: AC
  Filled 2023-09-24: qty 5

## 2023-09-24 MED ORDER — MEPERIDINE HCL 50 MG/ML IJ SOLN: 6.2500 mg | INTRAMUSCULAR | Status: DC | PRN

## 2023-09-24 MED ORDER — LACTATED RINGERS IV SOLN: INTRAVENOUS | Status: DC

## 2023-09-24 MED ORDER — MIDAZOLAM HCL 2 MG/2ML IJ SOLN: 0.5000 mg | Freq: Once | INTRAMUSCULAR | Status: DC | PRN

## 2023-09-24 MED ORDER — OXYCODONE HCL 5 MG PO TABS: 5.0000 mg | ORAL_TABLET | Freq: Once | ORAL | Status: DC | PRN

## 2023-09-24 MED ORDER — CIPROFLOXACIN IN D5W 400 MG/200ML IV SOLN
400.0000 mg | INTRAVENOUS | Status: AC
  Administered 2023-09-24: 400 mg via INTRAVENOUS
  Filled 2023-09-24: qty 200

## 2023-09-24 SURGICAL SUPPLY — 36 items
BAG COUNTER SPONGE SURGICOUNT (BAG) IMPLANT
BAG SPNG CNTER NS LX DISP (BAG)
BLADE CLIPPER SURG (BLADE) ×2 IMPLANT
CATH FOLEY 2WAY SLVR 5CC 16FR (CATHETERS) ×4 IMPLANT
CATH ROBINSON RED A/P 20FR (CATHETERS) ×2 IMPLANT
CLOTH BEACON ORANGE TIMEOUT ST (SAFETY) ×2 IMPLANT
COVER BACK TABLE 60X90IN (DRAPES) ×2 IMPLANT
COVER MAYO STAND STRL (DRAPES) IMPLANT
COVER SURGICAL LIGHT HANDLE (MISCELLANEOUS) ×2 IMPLANT
DRAPE SHEET LG 3/4 BI-LAMINATE (DRAPES) IMPLANT
DRAPE U-SHAPE 47X51 STRL (DRAPES) ×2 IMPLANT
DRSG TEGADERM 4X4.75 (GAUZE/BANDAGES/DRESSINGS) ×2 IMPLANT
DRSG TEGADERM 8X12 (GAUZE/BANDAGES/DRESSINGS) ×2 IMPLANT
GLOVE BIO SURGEON STRL SZ7.5 (GLOVE) IMPLANT
GLOVE ECLIPSE 8.0 STRL XLNG CF (GLOVE) IMPLANT
GOWN STRL REUS W/ TWL LRG LVL3 (GOWN DISPOSABLE) ×2 IMPLANT
GOWN STRL REUS W/ TWL XL LVL3 (GOWN DISPOSABLE) ×2 IMPLANT
GOWN STRL REUS W/TWL LRG LVL3 (GOWN DISPOSABLE) ×1
GOWN STRL REUS W/TWL XL LVL3 (GOWN DISPOSABLE) ×1
GRID BRACH TEMP 18GA 2.8X3X.75 (MISCELLANEOUS) IMPLANT
HOLDER FOLEY CATH W/STRAP (MISCELLANEOUS) ×2 IMPLANT
KIT TURNOVER KIT A (KITS) IMPLANT
MARKER SKIN DUAL TIP RULER LAB (MISCELLANEOUS) ×2 IMPLANT
NDL BRACHY 18G 5PK (NEEDLE) ×2 IMPLANT
NDL BRACHY 18G SINGLE (NEEDLE) ×2 IMPLANT
NDL PK MORGANSTERN STABILIZ (NEEDLE) IMPLANT
NEEDLE BRACHY 18G 5PK (NEEDLE) ×4
NEEDLE BRACHY 18G SINGLE (NEEDLE) ×1
NEEDLE PK MORGANSTERN STABILIZ (NEEDLE)
PACK CYSTO (CUSTOM PROCEDURE TRAY) ×2 IMPLANT
PENCIL SMOKE EVACUATOR (MISCELLANEOUS) IMPLANT
RADIOACTIVE SEED IMPLANT
SET CYSTO W/LG BORE CLAMP LF (SET/KITS/TRAYS/PACK) IMPLANT
SYR 10ML LL (SYRINGE) ×2 IMPLANT
UNDERPAD 30X36 HEAVY ABSORB (UNDERPADS AND DIAPERS) ×4 IMPLANT
WATER STERILE IRR 500ML POUR (IV SOLUTION) ×2 IMPLANT

## 2023-09-24 NOTE — Transfer of Care (Signed)
Immediate Anesthesia Transfer of Care Note  Patient: Matthew Sharp  Procedure(s) Performed: RADIOACTIVE SEED IMPLANT/BRACHYTHERAPY IMPLANT AND CYSTOSCOPY (Prostate)  Patient Location: PACU  Anesthesia Type:General  Level of Consciousness: awake and drowsy  Airway & Oxygen Therapy: Patient Spontanous Breathing and Patient connected to face mask oxygen  Post-op Assessment: Report given to RN and Post -op Vital signs reviewed and stable  Post vital signs: Reviewed and stable  Last Vitals:  Vitals Value Taken Time  BP    Temp    Pulse 87 09/24/23 1534  Resp 23 09/24/23 1534  SpO2 100 % 09/24/23 1534  Vitals shown include unfiled device data.  Last Pain:  Vitals:   09/24/23 1152  TempSrc:   PainSc: 3          Complications: No notable events documented.

## 2023-09-24 NOTE — Interval H&P Note (Signed)
History and Physical Interval Note:  09/24/2023 1:46 PM  Matthew Sharp  has presented today for surgery, with the diagnosis of prostate cancer.  The various methods of treatment have been discussed with the patient and family. After consideration of risks, benefits and other options for treatment, the patient has consented to  Procedure(s): RADIOACTIVE SEED IMPLANT/BRACHYTHERAPY IMPLANT (N/A) as a surgical intervention.  The patient's history has been reviewed, patient examined, no change in status, stable for surgery.  I have reviewed the patient's chart and labs.  Questions were answered to the patient's satisfaction.     Matthew Sharp

## 2023-09-24 NOTE — Progress Notes (Signed)
Radiation Oncology         [phone] ________________________________  Name: LONNEY REVAK MRN: [id number]  Date: 09/24/2023  DOB: [birth year]       Prostate Seed Implant  WU:[id number], Evlyn Courier., MD  Debroah Baller, MD  DIAGNOSIS:  72 y.o. gentleman with Stage T1c adenocarcinoma of the prostate with Gleason score of 3+4, and PSA of 4.8.   Oncology History  Malignant neoplasm of prostate (HCC)  06/16/2023 Cancer Staging   Staging form: Prostate, AJCC 8th Edition - Clinical stage from 06/16/2023: Stage IIB (cT1c, cN0, cM0, PSA: 4.8, Grade Group: 2) - Signed by Marcello Fennel, PA-C on 07/24/2023 Histopathologic type: Adenocarcinoma, NOS Stage prefix: Initial diagnosis Prostate specific antigen (PSA) range: Less than 10 Gleason primary pattern: 3 Gleason secondary pattern: 4 Gleason score: 7 Histologic grading system: 5 grade system Number of biopsy cores examined: 16 Number of biopsy cores positive: 5 Location of positive needle core biopsies: Both sides   07/24/2023 Initial Diagnosis   Malignant neoplasm of prostate (HCC)       ICD-10-CM   1. Pre-op testing  Z01.818 CBG per Guidelines for Diabetes Management for Patients Undergoing Surgery ([facility], [facility], and [facility] only)    CBG per protocol    I-Stat, Chem 8 on day of surgery per protocol    CBC    CANCELED: EKG 12 lead per protocol    2. Hypertension, unspecified type  I10 Basic metabolic panel      PROCEDURE: Insertion of radioactive I-125 seeds into the prostate gland.  RADIATION DOSE: 145 Gy, definitive therapy.  TECHNIQUE: JEMAL MISKELL was brought to the operating room with the urologist. He was placed in the dorsolithotomy position. He was catheterized and a rectal tube was inserted. The perineum was shaved, prepped and draped. The ultrasound probe was then introduced by me into the rectum to see the prostate gland.  TREATMENT DEVICE: I attached the needle grid to the ultrasound probe stand and anchor needles were placed.  3D  PLANNING: The prostate was imaged in 3D using a sagittal sweep of the prostate probe. These images were transferred to the planning computer. There, the prostate, urethra and rectum were defined on each axial reconstructed image. Then, the software created an optimized 3D plan and a few seed positions were adjusted. The quality of the plan was reviewed using [facility] information for the target and the following two organs at risk:  Urethra and Rectum.  Then the accepted plan was printed and handed off to the radiation therapist.  Under my supervision, the custom loading of the seeds and spacers was carried out using the quick loader.  These pre-loaded needles were then placed into the needle holder.Marland Kitchen  PROSTATE VOLUME STUDY:  Using transrectal ultrasound the volume of the prostate was verified to be 37.6 cc.  SPECIAL TREATMENT PROCEDURE/SUPERVISION AND HANDLING: The pre-loaded needles were then delivered by the urologist under sagittal guidance. A total of 18 needles were used to deposit 59 seeds in the prostate gland. The individual seed activity was 0.468 mCi.  SpaceOAR:  No  COMPLEX SIMULATION: At the end of the procedure, an anterior radiograph of the pelvis was obtained to document seed positioning and count. Cystoscopy was performed by the urologist to check the urethra and bladder.  MICRODOSIMETRY: At the end of the procedure, the patient was emitting 0.074 mR/hr at 1 meter. Accordingly, he was considered safe for hospital discharge.  PLAN: The patient will return to the radiation oncology clinic for post implant  CT dosimetry in three weeks.   ________________________________  Artist Pais Kathrynn Running, M.D.

## 2023-09-24 NOTE — Anesthesia Procedure Notes (Signed)
Procedure Name: Intubation Date/Time: 09/24/2023 2:06 PM  Performed by: Lezlie Lye, CRNAPre-anesthesia Checklist: Patient identified, Emergency Drugs available, Suction available and Patient being monitored Patient Re-evaluated:Patient Re-evaluated prior to induction Oxygen Delivery Method: Circle System Utilized Preoxygenation: Pre-oxygenation with 100% oxygen Induction Type: IV induction Ventilation: Mask ventilation without difficulty Laryngoscope Size: Miller and 3 Grade View: Grade I Tube type: Oral Tube size: 7.5 mm Number of attempts: 1 Airway Equipment and Method: Stylet Placement Confirmation: ETT inserted through vocal cords under direct vision, positive ETCO2 and breath sounds checked- equal and bilateral Secured at: 22 cm Tube secured with: Tape Dental Injury: Teeth and Oropharynx as per pre-operative assessment

## 2023-09-24 NOTE — Anesthesia Postprocedure Evaluation (Signed)
Anesthesia Post Note  Patient: WILBORN MEMBRENO  Procedure(s) Performed: RADIOACTIVE SEED IMPLANT/BRACHYTHERAPY IMPLANT AND CYSTOSCOPY (Prostate)     Patient location during evaluation: PACU Anesthesia Type: General Level of consciousness: awake and alert Pain management: pain level controlled Vital Signs Assessment: post-procedure vital signs reviewed and stable Respiratory status: spontaneous breathing, nonlabored ventilation, respiratory function stable and patient connected to nasal cannula oxygen Cardiovascular status: blood pressure returned to baseline and stable Postop Assessment: no apparent nausea or vomiting Anesthetic complications: no  No notable events documented.  Last Vitals:  Vitals:   09/24/23 1630 09/24/23 1650  BP: 116/79 129/76  Pulse: 64 61  Resp: 14 20  Temp:  36.7 C  SpO2: 100% 99%    Last Pain:  Vitals:   09/24/23 1650  TempSrc: Oral  PainSc: 0-No pain                 Geralyn Figiel S

## 2023-09-24 NOTE — Op Note (Signed)
NAMEJIREH, Matthew Sharp MEDICAL RECORD NO: [id number] ACCOUNT NO: [id number] DATE OF BIRTH: 01-Jun-1951 FACILITY: [facility] LOCATION: [facility] PHYSICIAN: Debroah Baller, MD  Operative Report   DATE OF PROCEDURE: 09/24/2023  PREOPERATIVE DIAGNOSIS:  Stage T1c adenocarcinoma of the prostate.  POSTOPERATIVE DIAGNOSIS:  Stage T1c adenocarcinoma of the prostate.  PROCEDURE:  Transrectal ultrasound of the prostate, I-125 radioactive intraprostatic seed implantation and cystoscopy.  SURGEONS OF RECORD:  Debroah Baller, MD and Dr. Margaretmary Dys.  HISTORY OF PRESENT ILLNESS:  The patient is a 72 year old white male with a diagnosis of prostate cancer with a Gleason score of 7 (3 + 4).  He has reviewed his options and has opted for definitive therapy with radioactive seed implantation.  PROCEDURE IN DETAIL:  The patient was brought into the operating room and placed on the table in the supine position.  After adequate general anesthesia was achieved, Foley catheter was placed to drain the bladder.  Then, the patient was carefully  positioned in exaggerated lithotomy with the yellow fin stirrups.  The scrotum was shaved, prepped, and elevated and taped in place onto the lower abdomen.  The perineum was then shaved, prepped, and draped for the performance of the procedure.  A red  rubber catheter was placed into the rectum to relieve excess air.  Transrectal ultrasound probe was then placed and prostatic anchors placed.  The prostate was scanned from apex to seminal vesicles.  The scanned images went into the computer on [place]  program.  The individual images were then reviewed by myself and Dr. Kathrynn Running and prostatic borders, urethra, and rectum were appropriately marked.  The computer then calculated seed placement.  All of the individual images were then reviewed by myself  and Dr. Kathrynn Running to ascertain seed placement.  Once we had good coverage of the prostate with sparing of the urethra and rectum, seed  implantation was begun.  The bladder neck was identified with contrast in the Foley balloon and noted to be in place.   Needles were then placed from anterior to posterior on the prostate to encompass the prostate in its entirety.  No portion of the prostate was left untreated.  Once the final seeds were placed, the anchor needles were removed and pelvic x-ray was  obtained.  This showed good distribution of the seeds.  Foley catheter was then removed.  The patient was re-prepped and draped and flexible cystoscopy performed.  This did not reveal any seeds within the bladder.  Under sterile conditions, Foley  catheter was replaced 16-French to drain the bladder.  Rectal exam was then performed and this did not reveal any foreign bodies in the rectum.  A total of 59 seeds were placed into the prostate.  The patient tolerated all of this well.  He was then  awakened and taken to the recovery room in good condition.   PUS D: 09/24/2023 3:53:24 pm T: 09/24/2023 7:37:00 pm  JOB: [id number]/ [id number]

## 2023-09-24 NOTE — Brief Op Note (Signed)
09/24/2023  3:42 PM  PATIENT:  Matthew Sharp  72 y.o. male  PRE-OPERATIVE DIAGNOSIS:  prostate cancer  POST-OPERATIVE DIAGNOSIS:  prostate cancer  PROCEDURE:  Procedure(s): RADIOACTIVE SEED IMPLANT/BRACHYTHERAPY IMPLANT AND CYSTOSCOPY (N/A)  SURGEON:  Surgeons and Role:    Debroah Baller, MD - Primary    * Margaretmary Dys, MD  PHYSICIAN ASSISTANT:   ASSISTANTS: none   ANESTHESIA:   general  EBL:  5 mL   BLOOD ADMINISTERED:none  DRAINS: Urinary Catheter (Foley)   LOCAL MEDICATIONS USED:  NONE  SPECIMEN:  No Specimen  DISPOSITION OF SPECIMEN:  N/A  COUNTS:  YES  TOURNIQUET:  * No tourniquets in log *  DICTATION: .Other Dictation: Dictation Number [id number]  PLAN OF CARE: Discharge to home after PACU  PATIENT DISPOSITION:  PACU - hemodynamically stable.   Delay start of Pharmacological VTE agent (>24hrs) due to surgical blood loss or risk of bleeding: yes

## 2023-09-25 ENCOUNTER — Encounter (HOSPITAL_COMMUNITY): Payer: Self-pay | Admitting: Urology

## 2023-10-13 ENCOUNTER — Telehealth: Payer: Self-pay | Admitting: *Deleted

## 2023-10-13 NOTE — Telephone Encounter (Signed)
CALLED PATIENT TO REMIND OF POST SEED APPTS. FOR 10-14-23- ARRIVAL TIME- 9:15 AM @ [facility], SPOKE WITH PATIENT AND HE IS AWARE OF THESE APPTS.

## 2023-10-14 ENCOUNTER — Encounter: Payer: Self-pay | Admitting: Urology

## 2023-10-14 ENCOUNTER — Ambulatory Visit
Admission: RE | Admit: 2023-10-14 | Discharge: 2023-10-14 | Disposition: A | Discharge status: Home / Self Care | Payer: PPO | Source: Ambulatory Visit | Attending: Urology | Admitting: Urology

## 2023-10-14 ENCOUNTER — Ambulatory Visit
Admission: RE | Admit: 2023-10-14 | Discharge: 2023-10-14 | Disposition: A | Discharge status: Home / Self Care | Payer: PPO | Source: Ambulatory Visit | Attending: Radiation Oncology | Admitting: Radiation Oncology

## 2023-10-14 VITALS — BP 119/91 | HR 75 | Temp 97.7°F | Resp 20 | Ht 70.0 in | Wt 199.4 lb

## 2023-10-14 DIAGNOSIS — C61 Malignant neoplasm of prostate: Secondary | ICD-10-CM | POA: Insufficient documentation

## 2023-10-14 DIAGNOSIS — Z923 Personal history of irradiation: Secondary | ICD-10-CM | POA: Insufficient documentation

## 2023-10-14 DIAGNOSIS — Z79899 Other long term (current) drug therapy: Secondary | ICD-10-CM | POA: Insufficient documentation

## 2023-10-14 NOTE — Progress Notes (Signed)
Radiation Oncology         ([phone]) [phone] ________________________________  Name: Matthew Sharp MRN: [id number]  Date: 10/14/2023  DOB: 03/21/1951  Post-Seed Follow-Up Visit Note  CC: Gordan Payment., MD  Debroah Baller, MD  Diagnosis:   72 y.o. gentleman with Stage T1c adenocarcinoma of the prostate with Gleason score of 3+4, and PSA of 4.8.     ICD-10-CM   1. Malignant neoplasm of prostate (HCC)  C61       Interval Since Last Radiation:  3 weeks 09/24/23:  Insertion of radioactive I-125 seeds into the prostate gland; 145 Gy, definitive therapy.  Narrative:  The patient returns today for routine follow-up.  He is complaining of increased urinary frequency and urinary hesitation symptoms. He filled out a questionnaire regarding urinary function today providing and overall IPSS score of 10 characterizing his symptoms as mild-moderate with daytime frequency but nocturia only x1. He has continued taking Flomax daily as prescribed and feels that he is tolerating the seeds well.  He specifically denies dysuria, gross hematuria, fever or chills. His pre-implant score was 1. He denies any abdominal pain or bowel symptoms.  Overall, he is quite pleased with his progress to date.  ALLERGIES:  is allergic to ciprofloxacin and statins.  Meds: Current Outpatient Medications  Medication Sig Dispense Refill   acetaminophen (TYLENOL) 500 MG tablet Take 1,000 mg by mouth every 6 (six) hours as needed (pain/headaches.).     ALPRAZolam (XANAX) 0.5 MG tablet Take 0.5 mg by mouth at bedtime.     Ascorbic Acid (VITAMIN C PO) Take 500 mg by mouth 3 (three) times a week.     budesonide-formoterol (SYMBICORT) 160-4.5 MCG/ACT inhaler Inhale 2 puffs into the lungs 2 (two) times daily as needed (respiratory issues.).     COMBIVENT RESPIMAT 20-100 MCG/ACT AERS respimat Inhale 1 puff into the lungs at bedtime.     Cyanocobalamin (VITAMIN B-12 PO) Take 1 tablet by mouth 3 (three) times a week.     fluticasone  (FLONASE) 50 MCG/ACT nasal spray Place 1 spray into both nostrils daily as needed (allergies/congestion.).     sertraline (ZOLOFT) 100 MG tablet Take 150 mg by mouth in the morning.     tadalafil (CIALIS) 5 MG tablet Take 5 mg by mouth at bedtime.     tamsulosin (FLOMAX) 0.4 MG CAPS capsule Take 0.4 mg by mouth at bedtime.     traMADol (ULTRAM) 50 MG tablet Take 100 mg by mouth daily as needed for severe pain (pain score 7-10).     No current facility-administered medications for this visit.    Physical Findings: In general this is a well appearing [place] male in no acute distress. He's alert and oriented x4 and appropriate throughout the examination. Cardiopulmonary assessment is negative for acute distress and he exhibits normal effort.   Lab Findings: Lab Results  Component Value Date   WBC 10.5 09/24/2023   HGB 14.2 09/24/2023   HCT 41.9 09/24/2023   MCV 88.0 09/24/2023   PLT 282 09/24/2023    Radiographic Findings:  Patient underwent CT imaging in our clinic for post implant dosimetry. The CT will be reviewed by Dr. Kathrynn Running to confirm there is an adequate distribution of radioactive seeds throughout the prostate gland and ensure that there are no seeds in or near the rectum.  We suspect the final radiation plan and dosimetry will show appropriate coverage of the prostate gland. He understands that we will call and inform him of any unexpected  findings on further review of his imaging and dosimetry.  Impression/Plan: 72 y.o. gentleman with Stage T1c adenocarcinoma of the prostate with Gleason score of 3+4, and PSA of 4.8.  The patient is recovering from the effects of radiation. His urinary symptoms should gradually improve over the next 4-6 months. We talked about this today. He is encouraged by his improvement already and is otherwise pleased with his outcome. We also talked about long-term follow-up for prostate cancer following seed implant. He understands that ongoing PSA  determinations and digital rectal exams will help perform surveillance to rule out disease recurrence. He has a follow up appointment scheduled with Dr. Saddie Benders in December 2024. He understands what to expect with his PSA measures. Patient was also educated today about some of the long-term effects from radiation including a small risk for rectal bleeding and possibly erectile dysfunction. We talked about some of the general management approaches to these potential complications. However, I did encourage the patient to contact our office or return at any point if he has questions or concerns related to his previous radiation and prostate cancer.    Marguarite Arbour, PA-C

## 2023-10-14 NOTE — Progress Notes (Signed)
Post-seed nursing interview for a diagnosis of 72 y.o. gentleman with Stage T1c adenocarcinoma of the prostate with Gleason score of 3+4, and PSA of 4.8.  Patient identity verified x2.   Patient reports fatigue, polyuria, urgency, and severe ED. Patient denies all other related issues at this time.  Meaningful use complete.  I-PSS score- 10 - Mild Moderate SHIM score- 1- No sexual activity within the last 6 months. Urinary Management medication(s) Tamsulosin Urology appointment date- 10/2023 with Dr. Betsy Coder at [facility]  Vitals- BP (!) 119/91 (BP Location: Right Arm, Patient Position: Sitting, Cuff Size: Normal)   Pulse 75   Temp 97.7 F (36.5 C) (Temporal)   Resp 20   Ht 5\' 10"  (1.778 m)   Wt 199 lb 6.4 oz (90.4 kg)   SpO2 98%   BMI 28.61 kg/m    Matthew Favors, LPN

## 2023-10-14 NOTE — Progress Notes (Signed)
  Radiation Oncology         [phone] ________________________________  Name: Matthew Sharp MRN: [id number]  Date: 10/14/2023  DOB: 27-Feb-1951  COMPLEX SIMULATION NOTE  NARRATIVE:  The patient was brought to the CT Simulation planning suite today following prostate seed implantation approximately one month ago.  Identity was confirmed.  All relevant records and images related to the planned course of therapy were reviewed.  Then, the patient was set-up supine.  CT images were obtained.  The CT images were loaded into the planning software.  Then the prostate and rectum were contoured.  Treatment planning then occurred.  The implanted iodine 125 seeds were identified by the physics staff for projection of radiation distribution  I have requested : 3D Simulation  I have requested a DVH of the following structures: Prostate and rectum.    ________________________________  Matthew Sharp, M.D.

## 2023-10-22 ENCOUNTER — Encounter: Payer: Self-pay | Admitting: Radiation Oncology

## 2023-10-23 ENCOUNTER — Ambulatory Visit
Admission: RE | Admit: 2023-10-23 | Discharge: 2023-10-23 | Disposition: A | Discharge status: Home / Self Care | Payer: PPO | Source: Ambulatory Visit | Attending: Radiation Oncology | Admitting: Radiation Oncology

## 2023-10-23 DIAGNOSIS — C61 Malignant neoplasm of prostate: Secondary | ICD-10-CM | POA: Diagnosis present

## 2023-10-23 NOTE — Radiation Completion Notes (Addendum)
  Radiation Oncology         [phone] ________________________________  Name: Matthew Sharp MRN: [id number]  Date: 10/22/2023  DOB: Feb 19, 1951  Referring Physician: GERMAIN BROTHERS, M.D. Date of Service: 2023-10-23 Radiation Oncologist: Adina Barge, M.D. [facility]     RADIATION ONCOLOGY END OF TREATMENT NOTE     Diagnosis: 72 y.o. gentleman with Stage T1c adenocarcinoma of the prostate with Gleason score of 3+4, and PSA of 4.8.   Intent: Curative     ==========DELIVERED PLANS==========  Prostate Seed Implant Date: 2023-09-24   Plan Name: Prostate Seed Implant Site: Prostate Technique: Radioactive Seed Implant I-125 Mode: Brachytherapy Dose Per Fraction: 145 Gy Prescribed Dose (Delivered / Prescribed): 145 Gy / 145 Gy Prescribed Fxs (Delivered / Prescribed): 1 / 1     ==========ON TREATMENT VISIT DATES========== 2023-09-24     The patient will receive a call in about one month from the radiation oncology department. He will continue follow up with Dr. BROTHERS as well.  ------------------------------------------------   Donnice Barge, MD [facility]  Radiation Oncology Direct Dial: [phone]  Fax: [phone] [email or web address]  Skype  [email or web address]

## 2023-10-23 NOTE — Progress Notes (Signed)
  Radiation Oncology         [phone] ________________________________  Name: Matthew Sharp MRN: [id number]  Date: 10/22/2023  DOB: 18-Aug-1951  3D Planning Note   Prostate Brachytherapy Post-Implant Dosimetry  Diagnosis: 72 y.o. gentleman with Stage T1c adenocarcinoma of the prostate with Gleason score of 3+4, and PSA of 4.8.   Narrative: On a previous date, Matthew Sharp returned following prostate seed implantation for post implant planning. He underwent CT scan complex simulation to delineate the three-dimensional structures of the pelvis and demonstrate the radiation distribution.  Since that time, the seed localization, and complex isodose planning with dose volume histograms have now been completed.  Results:   Prostate Coverage - The dose of radiation delivered to the 90% or more of the prostate gland (D90) was 129.77% of the prescription dose. This exceeds our goal of greater than 90%. Rectal Sparing - The volume of rectal tissue receiving the prescription dose or higher was 0.0 cc. This falls under our thresholds tolerance of 1.0 cc.  Impression: The prostate seed implant appears to show adequate target coverage and appropriate rectal sparing.  Plan:  The patient will continue to follow with urology for ongoing PSA determinations. I would anticipate a high likelihood for local tumor control with minimal risk for rectal morbidity.  ________________________________  Artist Pais Kathrynn Running, M.D.

## 2023-10-30 ENCOUNTER — Encounter: Payer: Self-pay | Admitting: *Deleted

## 2023-11-09 ENCOUNTER — Encounter: Payer: Self-pay | Admitting: *Deleted

## 2023-12-08 ENCOUNTER — Inpatient Hospital Stay: Payer: PPO | Attending: Adult Health | Admitting: *Deleted

## 2023-12-08 ENCOUNTER — Encounter: Payer: Self-pay | Admitting: *Deleted

## 2023-12-08 DIAGNOSIS — C61 Malignant neoplasm of prostate: Secondary | ICD-10-CM

## 2023-12-08 NOTE — Progress Notes (Signed)
SCP reviewed and completed. Last colonoscopy was 05/07/2023. Vaccines UTD. Pt has seen Dr.Chao twice since post-radiation. Pt says he turns in 6 mos.

## 2024-04-26 ENCOUNTER — Ambulatory Visit (HOSPITAL_BASED_OUTPATIENT_CLINIC_OR_DEPARTMENT_OTHER)
Admit: 2024-04-26 | Discharge: 2024-04-26 | Disposition: A | Discharge status: Home / Self Care | Attending: Family Medicine | Admitting: Family Medicine

## 2024-04-26 ENCOUNTER — Other Ambulatory Visit (HOSPITAL_BASED_OUTPATIENT_CLINIC_OR_DEPARTMENT_OTHER): Payer: Self-pay

## 2024-04-26 ENCOUNTER — Ambulatory Visit (HOSPITAL_BASED_OUTPATIENT_CLINIC_OR_DEPARTMENT_OTHER): Admission: EM | Admit: 2024-04-26 | Discharge: 2024-04-26 | Disposition: A | Discharge status: Home / Self Care

## 2024-04-26 ENCOUNTER — Encounter (HOSPITAL_BASED_OUTPATIENT_CLINIC_OR_DEPARTMENT_OTHER): Payer: Self-pay | Admitting: Emergency Medicine

## 2024-04-26 DIAGNOSIS — R0602 Shortness of breath: Secondary | ICD-10-CM

## 2024-04-26 DIAGNOSIS — J432 Centrilobular emphysema: Secondary | ICD-10-CM | POA: Diagnosis not present

## 2024-04-26 DIAGNOSIS — J189 Pneumonia, unspecified organism: Secondary | ICD-10-CM

## 2024-04-26 DIAGNOSIS — J849 Interstitial pulmonary disease, unspecified: Secondary | ICD-10-CM | POA: Diagnosis not present

## 2024-04-26 DIAGNOSIS — R051 Acute cough: Secondary | ICD-10-CM

## 2024-04-26 MED ORDER — IPRATROPIUM-ALBUTEROL 0.5-2.5 (3) MG/3ML IN SOLN
3.0000 mL | Freq: Once | RESPIRATORY_TRACT | Status: AC
  Administered 2024-04-26: 3 mL via RESPIRATORY_TRACT

## 2024-04-26 MED ORDER — CEFTRIAXONE SODIUM 1 G IJ SOLR
1.0000 g | Freq: Once | INTRAMUSCULAR | Status: AC
  Administered 2024-04-26: 1 g via INTRAMUSCULAR

## 2024-04-26 MED ORDER — BENZONATATE 200 MG PO CAPS
200.0000 mg | ORAL_CAPSULE | Freq: Three times a day (TID) | ORAL | 0 refills | Status: AC | PRN
  Filled 2024-04-26: qty 20, 7d supply, fill #0

## 2024-04-26 MED ORDER — PREDNISONE 20 MG PO TABS
20.0000 mg | ORAL_TABLET | Freq: Every day | ORAL | 0 refills | Status: AC
Start: 2024-04-26 — End: 2024-05-01
  Filled 2024-04-26: qty 5, 5d supply, fill #0

## 2024-04-26 MED ORDER — IPRATROPIUM-ALBUTEROL 0.5-2.5 (3) MG/3ML IN SOLN
3.0000 mL | RESPIRATORY_TRACT | 0 refills | Status: AC | PRN
  Filled 2024-04-26: qty 360, 20d supply, fill #0

## 2024-04-26 MED ORDER — DOXYCYCLINE HYCLATE 100 MG PO CAPS
100.0000 mg | ORAL_CAPSULE | Freq: Two times a day (BID) | ORAL | 0 refills | Status: AC
Start: 2024-04-26 — End: 2024-05-06
  Filled 2024-04-26: qty 20, 10d supply, fill #0

## 2024-04-26 MED ORDER — PROMETHAZINE-DM 6.25-15 MG/5ML PO SYRP
5.0000 mL | ORAL_SOLUTION | Freq: Four times a day (QID) | ORAL | 0 refills | Status: DC | PRN
  Filled 2024-04-26: qty 118, 6d supply, fill #0

## 2024-04-26 NOTE — ED Triage Notes (Signed)
 Pt c/o shortness of breath getting worse the past couple days, he has damaged lungs from Covid in 2021. Pt was wheezing today, he wears oxygen  at night at 2.5L.

## 2024-04-26 NOTE — Discharge Instructions (Addendum)
 Suspected community-acquired pneumonia: Chest x-ray is abnormal but read as changes of pulmonary fibrosis and possible mild pulmonary edema due to patient's chronic lung disease.  Based on his symptoms and condition, I am concerned that he has early pneumonia.  Rocephin/ceftriaxone, 1000 mg injection today.  Doxycycline 100 mg twice daily for 10 days.  DuoNeb treatments every 6 hours use at home.  Prednisone 20 mg daily for 5 days.  Promethazine DM, 5 mL, every 6 hours as needed for cough.  Get plenty of fluids and rest.  Continue inhalers as currently used at home.  Encouraged to make an appointment with pulmonology for follow-up.  See Dr. Eluterio Hamburg later this week, as planned for follow-up.

## 2024-04-26 NOTE — ED Provider Notes (Signed)
 [facility]    CSN: [id number] Arrival date & time: 04/26/24  1606      History   Chief Complaint Chief Complaint  Patient presents with   Shortness of Breath   Cough    HPI Matthew Sharp is a 73 y.o. male.   Reports that he has long-term Covid-19 with lung damage or scarring.  He had COVID-19 in 2021.  He uses oxygen  at night 2 L/min per nasal cannula.  He reports cough and congestion and shortness of breath since 04/22/24.  He is getting more and more short of breath.  He has a home albuterol  rescue inhaler and has used is several times a day in the last five days/   Shortness of Breath Associated symptoms: cough   Associated symptoms: no abdominal pain, no chest pain, no ear pain, no fever, no rash, no sore throat and no vomiting   Cough Associated symptoms: rhinorrhea and shortness of breath   Associated symptoms: no chest pain, no chills, no ear pain, no fever, no rash and no sore throat     Past Medical History:  Diagnosis Date   Abdominal aortic atherosclerosis (HCC)    Achilles tendinitis of left lower extremity    Anxiety    Arteriosclerotic heart disease    Barrett's esophagus    BPH (benign prostatic hyperplasia)    Centrilobular emphysema (HCC)    Chronic back pain    Class 1 obesity due to excess calories with serious comorbidity and body mass index (BMI) of 32.0 to 32.9 in adult    COPD (chronic obstructive pulmonary disease) (HCC)    Coronary artery disease    Degenerative lumbar disc    Dyspnea on exertion    Elevated PSA    Enthesopathy of ankle and tarsus    Essential hypertension    GERD (gastroesophageal reflux disease)    Hearing loss of right ear    History of COVID-19    History of kidney stones    Hypogonadism in male    Insomnia    Interstitial lung disease (HCC)    secondary to covid   Irritability and anger    LAFB (left anterior fascicular block)    Major depressive disorder, recurrent (HCC) 03/18/2016   Malaise and fatigue     Mild episode of recurrent major depressive disorder (HCC)    Mixed hyperlipidemia    Myalgia due to statin    On home oxygen  therapy    At night   OSA (obstructive sleep apnea)    Osteoarthritis    Pneumonia    with COVID   Prediabetes    Prostate cancer (HCC)    Retrocalcaneal bursitis, left    RLS (restless legs syndrome)    Stroke (HCC)    on Plavix , no deficits    Patient Active Problem List   Diagnosis Date Noted   Malignant neoplasm of prostate (HCC) 07/24/2023   Abdominal aortic atherosclerosis (HCC) 06/02/2022   Achilles tendinitis of left lower extremity 06/02/2022   Anxiety 06/02/2022   Arteriosclerotic heart disease 06/02/2022   Barrett's esophagus 06/02/2022   BPH (benign prostatic hyperplasia) 06/02/2022   Centrilobular emphysema (HCC) 06/02/2022   Chronic back pain 06/02/2022   Class 1 obesity due to excess calories with serious comorbidity and body mass index (BMI) of 32.0 to 32.9 in adult 06/02/2022   COPD (chronic obstructive pulmonary disease) (HCC) 06/02/2022   Coronary artery disease 06/02/2022   Degenerative lumbar disc 06/02/2022   Depression 06/02/2022  Dyspnea on exertion 06/02/2022   Elevated PSA 06/02/2022   Enthesopathy of ankle and tarsus 06/02/2022   Essential hypertension 06/02/2022   GERD (gastroesophageal reflux disease) 06/02/2022   Hearing loss of right ear 06/02/2022   History of COVID-19 06/02/2022   History of kidney stones 06/02/2022   History of stroke 06/02/2022   Hypertension 06/02/2022   Hypogonadism in male 06/02/2022   ILD (interstitial lung disease) (HCC) 06/02/2022   Insomnia 06/02/2022   Interstitial lung disease (HCC) 06/02/2022   Irritability and anger 06/02/2022   LAFB (left anterior fascicular block) 06/02/2022   Malaise and fatigue 06/02/2022   Mild episode of recurrent major depressive disorder (HCC) 06/02/2022   Mixed hyperlipidemia 06/02/2022   Myalgia due to statin 06/02/2022   OSA (obstructive sleep  apnea) 06/02/2022   Osteoarthritis 06/02/2022   Pneumonia 06/02/2022   Prediabetes 06/02/2022   Retrocalcaneal bursitis, left 06/02/2022   RLS (restless legs syndrome) 06/02/2022   Sleep apnea 06/02/2022   Stroke (HCC) 06/02/2022   Major depressive disorder, recurrent (HCC) 03/18/2016    Past Surgical History:  Procedure Laterality Date   COLONOSCOPY     DRUG INDUCED ENDOSCOPY N/A 08/14/2021   Procedure: DRUG INDUCED SLEEP ENDOSCOPY;  Surgeon: Virgina Grills, MD;  Location: [facility];  Service: ENT;  Laterality: N/A;   IMPLANTATION OF HYPOGLOSSAL NERVE STIMULATOR N/A 11/20/2021   Procedure: IMPLANTATION OF HYPOGLOSSAL NERVE STIMULATOR;  Surgeon: Virgina Grills, MD;  Location: [facility] OR;  Service: ENT;  Laterality: N/A;   LITHOTRIPSY  01/21/2019   PROSTATE BIOPSY     RADIOACTIVE SEED IMPLANT N/A 09/24/2023   Procedure: RADIOACTIVE SEED IMPLANT/BRACHYTHERAPY IMPLANT AND CYSTOSCOPY;  Surgeon: Wally Gunnels, MD;  Location: [facility];  Service: Urology;  Laterality: N/A;       Home Medications    Prior to Admission medications   Medication Sig Start Date End Date Taking? Authorizing Provider  ALPRAZolam  (XANAX ) 0.5 MG tablet Take 0.5 mg by mouth at bedtime.   Yes [provider]  benzonatate (TESSALON) 200 MG capsule Take 1 capsule (200 mg total) by mouth 3 (three) times daily as needed for cough. 04/26/24  Yes Guss Legacy, FNP  clopidogrel  (PLAVIX ) 75 MG tablet Take 1 tablet by mouth daily. 03/21/24  Yes [provider]  doxycycline (VIBRAMYCIN) 100 MG capsule Take 1 capsule (100 mg total) by mouth 2 (two) times daily for 10 days. 04/26/24 05/06/24 Yes Guss Legacy, FNP  ipratropium-albuterol  (DUONEB) 0.5-2.5 (3) MG/3ML SOLN Take 3 mLs by nebulization every 4 (four) hours as needed (wheezing). 04/26/24  Yes Guss Legacy, FNP  predniSONE (DELTASONE) 20 MG tablet Take 1 tablet (20 mg total) by mouth daily with breakfast for 5 days. 04/26/24 05/01/24 Yes Guss Legacy, FNP  sertraline (ZOLOFT) 100 MG tablet Take 150 mg by mouth in the morning. 01/14/22  Yes [provider]  tamsulosin  (FLOMAX ) 0.4 MG CAPS capsule Take 0.4 mg by mouth at bedtime.   Yes [provider]  traMADol  (ULTRAM ) 50 MG tablet Take 100 mg by mouth daily as needed for severe pain (pain score 7-10).   Yes [provider]  acetaminophen  (TYLENOL ) 500 MG tablet Take 1,000 mg by mouth every 6 (six) hours as needed (pain/headaches.).    [provider]  Ascorbic Acid (VITAMIN C PO) Take 500 mg by mouth 3 (three) times a week. Patient not taking: Reported on 12/08/2023    [provider]  COMBIVENT RESPIMAT 20-100 MCG/ACT AERS respimat Inhale 1 puff into the lungs  at bedtime. 08/19/23 08/18/24  [provider]  Cyanocobalamin (VITAMIN B-12 PO) Take 1 tablet by mouth 3 (three) times a week. Patient not taking: Reported on 12/08/2023    [provider]  fluticasone  (FLONASE ) 50 MCG/ACT nasal spray Place 1 spray into both nostrils daily as needed (allergies/congestion.).    [provider]  tadalafil (CIALIS) 5 MG tablet Take 5 mg by mouth at bedtime.    [provider]  Tiotropium Bromide-Olodaterol (STIOLTO RESPIMAT) 2.5-2.5 MCG/ACT AERS Inhale 2.5 mcg into the lungs daily. Pt takes 2 puffs daily    [provider]    Family History Family History  Problem Relation Age of Onset   Heart attack Mother    Heart attack Brother    Hypertension Neg Hx    Heart disease Neg Hx    Diabetes Neg Hx    Cancer Neg Hx     Social History Social History   Tobacco Use   Smoking status: Former    Current packs/day: 0.00    Types: Cigarettes    Quit date: 1999    Years since quitting: 26.4   Smokeless tobacco: Never  Vaping Use   Vaping status: Never Used  Substance Use Topics   Alcohol use: Not Currently   Drug use: Never     Allergies   Ciprofloxacin  and Statins   Review of Systems Review of  Systems  Constitutional:  Negative for chills and fever.  HENT:  Positive for postnasal drip and rhinorrhea. Negative for ear pain and sore throat.   Eyes:  Negative for pain and visual disturbance.  Respiratory:  Positive for cough and shortness of breath.   Cardiovascular:  Negative for chest pain and palpitations.  Gastrointestinal:  Negative for abdominal pain, constipation, diarrhea, nausea and vomiting.  Genitourinary:  Negative for dysuria and hematuria.  Musculoskeletal:  Negative for arthralgias and back pain.  Skin:  Negative for color change and rash.  Neurological:  Negative for seizures and syncope.  All other systems reviewed and are negative.    Physical Exam Triage Vital Signs ED Triage Vitals  Encounter Vitals Group     BP 04/26/24 1620 128/79     Systolic BP Percentile --      Diastolic BP Percentile --      Pulse Rate 04/26/24 1620 94     Resp 04/26/24 1620 20     Temp 04/26/24 1620 98.6 F (37 C)     Temp Source 04/26/24 1620 Oral     SpO2 04/26/24 1620 92 %     Weight --      Height --      Head Circumference --      Peak Flow --      Pain Score 04/26/24 1617 0     Pain Loc --      Pain Education --      Exclude from Growth Chart --    No data found.  Updated Vital Signs BP 128/79 (BP Location: Right Arm)   Pulse 94   Temp 98.6 F (37 C) (Oral)   Resp 20   SpO2 94%   Visual Acuity Right Eye Distance:   Left Eye Distance:   Bilateral Distance:    Right Eye Near:   Left Eye Near:    Bilateral Near:     Physical Exam Vitals and nursing note reviewed.  Constitutional:      General: He is not in acute distress.    Appearance: He is well-developed.  He is not ill-appearing or toxic-appearing.  HENT:     Head: Normocephalic and atraumatic.     Right Ear: Hearing, tympanic membrane, ear canal and external ear normal.     Left Ear: Hearing, tympanic membrane, ear canal and external ear normal.     Nose: Congestion and rhinorrhea present.  Rhinorrhea is clear.     Right Sinus: No maxillary sinus tenderness or frontal sinus tenderness.     Left Sinus: No maxillary sinus tenderness or frontal sinus tenderness.     Mouth/Throat:     Lips: Pink.     Mouth: Mucous membranes are moist.     Pharynx: Uvula midline. No oropharyngeal exudate or posterior oropharyngeal erythema.     Tonsils: No tonsillar exudate.  Eyes:     Conjunctiva/sclera: Conjunctivae normal.     Pupils: Pupils are equal, round, and reactive to light.  Cardiovascular:     Rate and Rhythm: Normal rate and regular rhythm.     Heart sounds: S1 normal and S2 normal. No murmur heard. Pulmonary:     Effort: Pulmonary effort is normal. No respiratory distress.     Breath sounds: Examination of the right-upper field reveals decreased breath sounds and wheezing. Examination of the left-upper field reveals decreased breath sounds and wheezing. Examination of the right-middle field reveals decreased breath sounds and wheezing. Examination of the left-middle field reveals decreased breath sounds and wheezing. Examination of the right-lower field reveals decreased breath sounds and wheezing. Examination of the left-lower field reveals decreased breath sounds and wheezing. Decreased breath sounds and wheezing present. No rhonchi or rales.     Comments: Reassessment after DuoNeb treatment: Breath sounds are more audible throughout continues with wheezing but wheezing has reduced oxygen  saturation was 92 to 93% on room air prior to the treatment oxygen  saturation is 94% on room air now Abdominal:     General: Bowel sounds are normal.     Palpations: Abdomen is soft.     Tenderness: There is no abdominal tenderness.  Musculoskeletal:        General: No swelling.     Cervical back: Neck supple.  Lymphadenopathy:     Head:     Right side of head: No submental, submandibular, tonsillar, preauricular or posterior auricular adenopathy.     Left side of head: No submental,  submandibular, tonsillar, preauricular or posterior auricular adenopathy.     Cervical: No cervical adenopathy.     Right cervical: No superficial cervical adenopathy.    Left cervical: No superficial cervical adenopathy.  Skin:    General: Skin is warm and dry.     Capillary Refill: Capillary refill takes less than 2 seconds.     Findings: No rash.  Neurological:     Mental Status: He is alert and oriented to person, place, and time.  Psychiatric:        Mood and Affect: Mood normal.      UC Treatments / Results  Labs (all labs ordered are listed, but only abnormal results are displayed) Labs Reviewed - No data to display  EKG   Radiology DG Chest 2 View Result Date: 04/26/2024 CLINICAL DATA:  Several day history of worsening shortness of breath chest EXAM: CHEST - 2 VIEW COMPARISON:  Radiograph dated 11/20/2021 FINDINGS: Nerve stimulator within the right anterior chest wall with lead terminating above the field of view. Additional implanted device within a right anterior chest. Low lung volumes with bronchovascular crowding. Diffuse bilateral interstitial opacities with slightly increased patchy left  basilar opacity. No pleural effusion or pneumothorax. Similar enlarged cardiomediastinal silhouette. No acute osseous abnormality. IMPRESSION: 1. Diffuse bilateral interstitial opacities with slightly increased patchy left basilar opacity, which may represent superimposed pulmonary edema or acute on chronic pulmonary fibrosis. 2. Similar cardiomegaly. Electronically Signed   By: Limin  Xu M.D.   On: 04/26/2024 17:03    Procedures Procedures (including critical care time)  Medications Ordered in UC Medications  ipratropium-albuterol  (DUONEB) 0.5-2.5 (3) MG/3ML nebulizer solution 3 mL (3 mLs Nebulization Given 04/26/24 1626)  cefTRIAXone (ROCEPHIN) injection 1 g (1 g Intramuscular Given 04/26/24 1726)    Initial Impression / Assessment and Plan / UC Course  I have reviewed the triage  vital signs and the nursing notes.  Pertinent labs & imaging results that were available during my care of the patient were reviewed by me and considered in my medical decision making (see chart for details).  Plan of Care: Chronic lung disease versus suspected community-acquired pneumonia: Chest x-ray did not show consolidation nor pneumonia.  It showed changes of pulmonary fibrosis and possible pulmonary edema.  I think based on his clinical condition, I will treat for community-acquired pneumonia.  Ceftriaxone 1000 mg injection in the office.  Doxycycline 100 mg twice daily for 10 days.  Prednisone 20 mg daily for 5 days.  Benzonatate, 200 mg, 1 every 8 hours if needed for cough or may use Promethazine DM, 5 mL, every 6 hours as needed for cough.  Patient reports that his primary care did not him using any dear DM medications so she may not get the Promethazine DM.  Continue inhalers as previously prescribed by pulmonology.  Added DuoNeb ampules to be used in her home nebulizer machine every 6 hours if needed for wheezing.  Patient has an appointment with Dr. Eluterio Hamburg at the end of the month and should be rechecked at that time.  Encouraged to contact pulmonology for follow-up with pulmonology also.  Follow-up here as needed.  I reviewed the plan of care with the patient and/or the patient's guardian.  The patient and/or guardian had time to ask questions and acknowledged that the questions were answered.  I provided instruction on symptoms or reasons to return here or to go to an ER, if symptoms/condition did not improve, worsened or if new symptoms occurred.  Final Clinical Impressions(s) / UC Diagnoses   Final diagnoses:  Acute cough  Shortness of breath  Community acquired pneumonia, unspecified laterality  Centrilobular emphysema (HCC)  Interstitial pulmonary disease, unspecified (HCC)     Discharge Instructions      Suspected community-acquired pneumonia: Chest x-ray is abnormal but  read as changes of pulmonary fibrosis and possible mild pulmonary edema due to patient's chronic lung disease.  Based on his symptoms and condition, I am concerned that he has early pneumonia.  Rocephin/ceftriaxone, 1000 mg injection today.  Doxycycline 100 mg twice daily for 10 days.  DuoNeb treatments every 6 hours use at home.  Prednisone 20 mg daily for 5 days.  Promethazine DM, 5 mL, every 6 hours as needed for cough.  Get plenty of fluids and rest.  Continue inhalers as currently used at home.  Encouraged to make an appointment with pulmonology for follow-up.  See Dr. Eluterio Hamburg later this week, as planned for follow-up.   ED Prescriptions     Medication Sig Dispense Auth. Provider   ipratropium-albuterol  (DUONEB) 0.5-2.5 (3) MG/3ML SOLN Take 3 mLs by nebulization every 4 (four) hours as needed (wheezing). 360 mL Guss Legacy, FNP  doxycycline (VIBRAMYCIN) 100 MG capsule Take 1 capsule (100 mg total) by mouth 2 (two) times daily for 10 days. 20 capsule Guss Legacy, FNP   promethazine-dextromethorphan (PROMETHAZINE-DM) 6.25-15 MG/5ML syrup  (Status: Discontinued) Take 5 mLs by mouth 4 (four) times daily as needed. 118 mL Guss Legacy, FNP   predniSONE (DELTASONE) 20 MG tablet Take 1 tablet (20 mg total) by mouth daily with breakfast for 5 days. 5 tablet Belen Zwahlen, FNP   benzonatate (TESSALON) 200 MG capsule Take 1 capsule (200 mg total) by mouth 3 (three) times daily as needed for cough. 20 capsule Guss Legacy, FNP      PDMP not reviewed this encounter.   Guss Legacy, FNP 04/26/24 [phone]

## 2024-04-28 ENCOUNTER — Other Ambulatory Visit (HOSPITAL_BASED_OUTPATIENT_CLINIC_OR_DEPARTMENT_OTHER): Payer: Self-pay

## 2024-05-06 ENCOUNTER — Other Ambulatory Visit (HOSPITAL_BASED_OUTPATIENT_CLINIC_OR_DEPARTMENT_OTHER): Payer: Self-pay
